# Patient Record
Sex: Male | Born: 1950 | Race: White | Hispanic: No | Marital: Married | Smoking: Former smoker
Health system: Southern US, Community
[De-identification: ages and names within clinical notes are randomized; demographics above are authoritative.]

## PROBLEM LIST (undated history)

## (undated) DIAGNOSIS — Z95810 Presence of automatic (implantable) cardiac defibrillator: Secondary | ICD-10-CM

## (undated) DIAGNOSIS — I447 Left bundle-branch block, unspecified: Secondary | ICD-10-CM

## (undated) DIAGNOSIS — I472 Ventricular tachycardia, unspecified: Secondary | ICD-10-CM

## (undated) DIAGNOSIS — E669 Obesity, unspecified: Secondary | ICD-10-CM

## (undated) DIAGNOSIS — I10 Essential (primary) hypertension: Secondary | ICD-10-CM

## (undated) DIAGNOSIS — E785 Hyperlipidemia, unspecified: Secondary | ICD-10-CM

## (undated) DIAGNOSIS — M1990 Unspecified osteoarthritis, unspecified site: Secondary | ICD-10-CM

## (undated) DIAGNOSIS — I255 Ischemic cardiomyopathy: Secondary | ICD-10-CM

## (undated) DIAGNOSIS — T465X1A Poisoning by other antihypertensive drugs, accidental (unintentional), initial encounter: Secondary | ICD-10-CM

## (undated) DIAGNOSIS — T464X5A Adverse effect of angiotensin-converting-enzyme inhibitors, initial encounter: Secondary | ICD-10-CM

## (undated) DIAGNOSIS — I219 Acute myocardial infarction, unspecified: Secondary | ICD-10-CM

## (undated) DIAGNOSIS — N141 Nephropathy induced by other drugs, medicaments and biological substances: Secondary | ICD-10-CM

## (undated) DIAGNOSIS — I519 Heart disease, unspecified: Secondary | ICD-10-CM

## (undated) DIAGNOSIS — M109 Gout, unspecified: Secondary | ICD-10-CM

## (undated) DIAGNOSIS — B019 Varicella without complication: Secondary | ICD-10-CM

## (undated) DIAGNOSIS — N200 Calculus of kidney: Secondary | ICD-10-CM

## (undated) HISTORY — DX: Hyperlipidemia, unspecified: E78.5

## (undated) HISTORY — DX: Essential (primary) hypertension: I10

## (undated) HISTORY — DX: Left bundle-branch block, unspecified: I44.7

## (undated) HISTORY — DX: Ischemic cardiomyopathy: I25.5

## (undated) HISTORY — DX: Nephropathy induced by other drugs, medicaments and biological substances: N14.1

## (undated) HISTORY — DX: Gout, unspecified: M10.9

## (undated) HISTORY — DX: Ventricular tachycardia: I47.2

## (undated) HISTORY — DX: Poisoning by other antihypertensive drugs, accidental (unintentional), initial encounter: T46.5X1A

## (undated) HISTORY — DX: Adverse effect of angiotensin-converting-enzyme inhibitors, initial encounter: T46.4X5A

## (undated) HISTORY — DX: Obesity, unspecified: E66.9

## (undated) HISTORY — DX: Heart disease, unspecified: I51.9

## (undated) HISTORY — PX: CHOLECYSTECTOMY: SHX55

## (undated) HISTORY — DX: Ventricular tachycardia, unspecified: I47.20

## (undated) HISTORY — DX: Varicella without complication: B01.9

## (undated) HISTORY — DX: Calculus of kidney: N20.0

---
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
## 1989-11-21 DIAGNOSIS — I219 Acute myocardial infarction, unspecified: Secondary | ICD-10-CM

## 1989-11-21 HISTORY — DX: Acute myocardial infarction, unspecified: I21.9

## 1998-05-01 ENCOUNTER — Encounter: Payer: Self-pay | Admitting: Internal Medicine

## 2003-09-22 ENCOUNTER — Other Ambulatory Visit: Payer: Self-pay

## 2003-11-22 HISTORY — PX: KNEE SURGERY: SHX244

## 2006-10-18 ENCOUNTER — Ambulatory Visit: Payer: Self-pay | Admitting: Internal Medicine

## 2006-10-25 ENCOUNTER — Ambulatory Visit: Payer: Self-pay | Admitting: Cardiology

## 2006-11-21 HISTORY — PX: IMPLANTABLE CARDIOVERTER DEFIBRILLATOR IMPLANT: SHX5860

## 2006-11-28 ENCOUNTER — Ambulatory Visit: Payer: Self-pay

## 2006-11-28 ENCOUNTER — Encounter: Payer: Self-pay | Admitting: Cardiology

## 2006-12-08 ENCOUNTER — Ambulatory Visit: Payer: Self-pay | Admitting: Internal Medicine

## 2006-12-08 LAB — CONVERTED CEMR LAB
Calcium: 8.8 mg/dL (ref 8.4–10.5)
Chloride: 107 meq/L (ref 96–112)
GFR calc Af Amer: 89 mL/min
GFR calc non Af Amer: 74 mL/min
INR: 1.2 (ref 0.9–2.0)

## 2006-12-12 ENCOUNTER — Ambulatory Visit: Payer: Self-pay | Admitting: Internal Medicine

## 2006-12-12 ENCOUNTER — Observation Stay (HOSPITAL_COMMUNITY): Admission: RE | Admit: 2006-12-12 | Discharge: 2006-12-13 | Payer: Self-pay | Admitting: Internal Medicine

## 2006-12-25 ENCOUNTER — Ambulatory Visit: Payer: Self-pay

## 2007-05-23 ENCOUNTER — Ambulatory Visit: Payer: Self-pay | Admitting: Internal Medicine

## 2007-08-17 ENCOUNTER — Ambulatory Visit: Payer: Self-pay | Admitting: Internal Medicine

## 2007-12-03 ENCOUNTER — Ambulatory Visit: Payer: Self-pay | Admitting: Internal Medicine

## 2007-12-03 ENCOUNTER — Ambulatory Visit: Payer: Self-pay

## 2008-02-28 ENCOUNTER — Ambulatory Visit: Payer: Self-pay | Admitting: Internal Medicine

## 2008-05-22 ENCOUNTER — Ambulatory Visit: Payer: Self-pay | Admitting: Internal Medicine

## 2008-09-04 ENCOUNTER — Ambulatory Visit: Payer: Self-pay | Admitting: Internal Medicine

## 2008-12-03 ENCOUNTER — Ambulatory Visit: Payer: Self-pay | Admitting: Internal Medicine

## 2008-12-04 ENCOUNTER — Encounter: Payer: Self-pay | Admitting: Internal Medicine

## 2009-03-02 ENCOUNTER — Ambulatory Visit: Payer: Self-pay | Admitting: Internal Medicine

## 2009-05-21 DIAGNOSIS — I255 Ischemic cardiomyopathy: Secondary | ICD-10-CM

## 2009-05-21 DIAGNOSIS — I447 Left bundle-branch block, unspecified: Secondary | ICD-10-CM

## 2009-05-21 DIAGNOSIS — I10 Essential (primary) hypertension: Secondary | ICD-10-CM | POA: Insufficient documentation

## 2009-05-28 ENCOUNTER — Ambulatory Visit: Payer: Self-pay | Admitting: Internal Medicine

## 2009-05-28 ENCOUNTER — Encounter: Payer: Self-pay | Admitting: Internal Medicine

## 2009-05-28 DIAGNOSIS — I472 Ventricular tachycardia: Secondary | ICD-10-CM

## 2009-08-27 ENCOUNTER — Ambulatory Visit: Payer: Self-pay | Admitting: Internal Medicine

## 2010-05-28 ENCOUNTER — Ambulatory Visit: Payer: Self-pay | Admitting: Internal Medicine

## 2010-05-28 DIAGNOSIS — R609 Edema, unspecified: Secondary | ICD-10-CM

## 2010-08-26 ENCOUNTER — Ambulatory Visit: Payer: Self-pay | Admitting: Internal Medicine

## 2010-12-02 ENCOUNTER — Encounter: Payer: Self-pay | Admitting: Internal Medicine

## 2010-12-02 ENCOUNTER — Ambulatory Visit
Admission: RE | Admit: 2010-12-02 | Discharge: 2010-12-02 | Payer: Self-pay | Source: Home / Self Care | Attending: Internal Medicine | Admitting: Internal Medicine

## 2010-12-21 NOTE — Cardiovascular Report (Signed)
Summary: Office Visit Remote   Office Visit Remote   Imported By: Roderic Ovens 09/30/2010 15:14:59  _____________________________________________________________________  External Attachment:    Type:   Image     Comment:   External Document

## 2010-12-21 NOTE — Assessment & Plan Note (Signed)
Summary: icd check/guidant   Visit Type:  Follow-up Primary Provider:  Aram Beecham  CC:  passed out 4 weeks ago.  History of Present Illness: Mr.Calkin is seen in followup for an ICD implanted as part of MADIT CRT.   He has a history of ischemic cardiomyopathy depressed LV function and left bundle branch block.  He had a syncopal episode on June 13. He was seen by his primary care physician. He is thought to be orthostatic. His medications were adjusted. Since that time he has had problems with increasing fluid accumulation. Shortness of breath has been stable. There has been no chest pain.     Current Medications (verified): 1)  Fish Oil 1000 Mg Caps (Omega-3 Fatty Acids) .... Once Daily 2)  Niacin Cr 500 Mg Cr-Caps (Niacin) .... Three Tabs Three Times A Day 3)  Vasotec 10 Mg Tabs (Enalapril Maleate) .... Take 1 Tablet By Mouth Once A Day 4)  Eql Vitamin E 400 Unit Caps (Vitamin E) .... Once Daily 5)  Aspirin 81 Mg Tabs (Aspirin) .... Once Daily 6)  Potassium Chloride Crys Cr 20 Meq Cr-Tabs (Potassium Chloride Crys Cr) .... Every Other Day 7)  Lasix 40 Mg Tabs (Furosemide) .... 1/2 Tab Every Other Day 8)  Metoprolol Tartrate 100 Mg Tabs (Metoprolol Tartrate) .... Take One Tablet By Mouth Twice A Day 9)  Multivitamins  Tabs (Multiple Vitamin) .... Once Daily  Allergies (verified): No Known Drug Allergies  Past History:  Past Medical History: Last updated: 05/21/2009 Ischemic cardiomyopathy.  Status post implantable cardioverter-defibrillator as part of #1. --Guidant Vitality T175 Left bundle branch block.  Obesity Hypertension Dyslipidemia MADIT - CRT trial  Past Surgical History: Last updated: 05/21/2009 AICD-Guidant Vitality T175  Vital Signs:  Patient profile:   60 year old male Height:      70 inches Weight:      300 pounds BMI:     43.20 Pulse rate:   76 / minute BP sitting:   123 / 83  (left arm) Cuff size:   large  Vitals Entered By: Hardin Negus,  RMA (May 28, 2010 3:11 PM)  Physical Exam  General:  The patient was alert and oriented in no acute distress. HEENT Normal.  Neck veins were flat, carotids were brisk.  Lungs were clear.  Heart sounds were regular without murmurs or gallops.  Abdomen was soft with active bowel sounds. There is no clubbing cyanosis or edema. Skin Warm and dry     ICD Specifications Following MD:  Sherryl Manges, MD     ICD Vendor:  [facility] [employer]     ICD Model Number:  T175     ICD Serial Number:  [id number] ICD DOI:  12/12/2006     ICD Implanting MD:  Sherryl Manges, MD  Lead 1:    Location: RV     DOI: 12/12/2006     Model #: [id number]     Serial #: [id number]     Status: active  Indications::  ICM, MADIT-CRT   ICD Follow Up Remote Check?  No Battery Voltage:  2.85 V     Charge Time:  7.7 seconds     Battery Est. Longevity:  BOL Underlying rhythm:  SR ICD Dependent:  No       ICD Device Measurements Right Ventricle:  Amplitude: 14.6 mV, Impedance: 512 ohms, Threshold: 0.6 V at 0.5 msec Shock Impedance: 44 ohms   Episodes Shock:  1     ATP:  6  Nonsustained:  1     Ventricular Pacing:  0%  Brady Parameters Mode VVI     Lower Rate Limit:  40      Tachy Zones VF:  210     VT:  180     Next Remote Date:  08/26/2010     Next Cardiology Appt Due:  05/22/2011 Tech Comments:  No parameter changes.  1 VT episodes treated with ATP x6 unseccessful, 23Jshock successful.  Mr. Angerer went to the ED @ [facility] for a syncopal episode but they  did not interrogate the ICD.  He will start Latitude transmissions every 3 months.  ROV 1 year with Dr. Graciela Husbands in [place]. Altha Harm, LPN  May 28, 2129 3:33 PM   Impression & Recommendations:  Problem # 1:  VENTRICULAR TACHYCARDIA (ICD-427.1) the patient had syncopal ventricular tachycardia,  it is monomorphic and responded shock therapy. His accelerated by antitachycardia pacing.  This is his first episode. No other medical taking changes we made at this time. We  will however resume his diuretic and increase his enalapril back to where it was.  He is advised not to drive for total of 2 months His updated medication list for this problem includes:    Vasotec 10 Mg Tabs (Enalapril maleate) .Marland Kitchen... Take 1 tablet by mouth once a day    Aspirin 81 Mg Tabs (Aspirin) ..... Once daily    Metoprolol Tartrate 100 Mg Tabs (Metoprolol tartrate) .Marland Kitchen... Take one tablet by mouth twice a day  Problem # 2:  CARDIOMYOPATHY, ISCHEMIC (ICD-414.8) stable medication adjustments as above The following medications were removed from the medication list:    Furosemide 40 Mg Tabs (Furosemide) His updated medication list for this problem includes:    Vasotec 10 Mg Tabs (Enalapril maleate) .Marland Kitchen... Take 1 tablet by mouth once a day    Aspirin 81 Mg Tabs (Aspirin) ..... Once daily    Lasix 40 Mg Tabs (Furosemide) .Marland Kitchen... Take 1 tablet by mouth once a day    Metoprolol Tartrate 100 Mg Tabs (Metoprolol tartrate) .Marland Kitchen... Take one tablet by mouth twice a day  Problem # 3:  IMPLANTATION OF DEFIBRILLATOR, GUIDANT VITALITY T 175 (ICD-V45.02) Device parameters and data were reviewed and no changes were made  Problem # 4:  PERIPHERAL EDEMA (ICD-782.3) tpatient is peripheral edema that has worsened since his diuretic was cut back; we'll increase it back to what it was. He has had metabolic profile checked by Dr. Judithann Sheen  Patient Instructions: 1)  Your physician wants you to follow-up in:   You will receive a reminder letter in the mail two months in advance. If you don't receive a letter, please call our office to schedule the follow-up appointment. 2)  Your physician has recommended you make the following change in your medication: START furosemide daily and vasotec

## 2011-01-12 NOTE — Cardiovascular Report (Signed)
Summary: Office Visit Remote   Office Visit Remote   Imported By: Roderic Ovens 01/04/2011 14:26:18  _____________________________________________________________________  External Attachment:    Type:   Image     Comment:   External Document

## 2011-03-03 ENCOUNTER — Ambulatory Visit (INDEPENDENT_AMBULATORY_CARE_PROVIDER_SITE_OTHER): Payer: PRIVATE HEALTH INSURANCE | Admitting: *Deleted

## 2011-03-03 ENCOUNTER — Other Ambulatory Visit: Payer: Self-pay

## 2011-03-03 DIAGNOSIS — I428 Other cardiomyopathies: Secondary | ICD-10-CM

## 2011-03-03 DIAGNOSIS — I472 Ventricular tachycardia: Secondary | ICD-10-CM

## 2011-03-10 NOTE — Progress Notes (Signed)
icd remote  

## 2011-03-22 ENCOUNTER — Encounter: Payer: Self-pay | Admitting: *Deleted

## 2011-04-05 NOTE — Letter (Signed)
May 22, 2008    Duane Lope. Judithann Sheen, MD  [facility]  [address]  [place], [place]  [address]   RE:  LEOPOLD, SMYERS  MRN:  [id number]  /  DOB:  29-Jun-1951   Dear [name],   Mr. Loreta Ave came in today.  As you recall, he was part of the MADIT-CRT  trial which was just closed because it showed that patients with class I  and II symptoms and left bundle-branch block in the setting of  cardiomyopathy had a 30% reduction in congestive heart failure defined  by hospitalizations and/or intravenous therapy.  We do not have the  absolute numbers yet, but is still a fairly striking finding.  Mr.  Loreta Ave was randomized to no LV lead and so we will have to address this  as we get more recommendations and an EKG, doing really well apart from  the fact that his weight has not changed.   His medications include,  1. Metoprolol 200 b.i.d.  2. Niacin 1500 t.i.d.  3. Aspirin.  4. He is also on Vasotec 10 b.i.d.   On examination, his blood pressure today was 103/62 with a pulse of 63.  His lungs were clear.  His heart sounds were regular.  His extremities  were without edema.   Interrogation of his ICD demonstrated an R-wave of 14.6 with impedance  of 524, threshold of 28.5, and battery voltage 3.20.   IMPRESSION:  1. Ischemic cardiomyopathy.  2. Left bundle-branch block.  3. Status post implantable cardioverter-defibrillator as part of MADIT-      CRT.  4. Obesity.   Mr. Loreta Ave is doing well.  We will let you know what we get in terms of  recommendations for followup in this accord.   Thanks very much for allowing [place] to participate in his care.    Sincerely,      Duke Salvia, MD, [facility]  Electronically Signed    SCK/MedQ  DD: 05/22/2008  DT: 05/22/2008  Job #: [id number]

## 2011-04-05 NOTE — Letter (Signed)
May 23, 2007    Dr. Aram Beecham   RE:  Billy [name], Billy [name]  MRN:  [id number]  /  DOB:  [birth year]   Dear [name]:   Billy [name] got lost in our office today, so it took me about an hour to  find him.  He is doing pretty well except that he has had significant  problems with peripheral edema as you know.   His medications are notable for him being on short-acting metoprolol at  100 b.i.d., Vasotec 10 b.i.d.  His Lasix dose is only 10 a day with a  potassium of 20 taken every other day.   On examination today, his blood pressure was 130/82, his pulse was 78,  LUNGS:  Clear.  HEART SOUNDS:  Regular, but he had 2 to 3+ peripheral edema bilaterally.   Interrogation of his Guidant ICD implant, that is further made at CRT  trial, demonstrates an R wave of 15 with an impedance of 563, a  threshold of 0.8 volts at 0.5 milliseconds with a high voltage impedance  of 44 ohms, there is no intercurrent therapies, there is one diverted  episode in March.   IMPRESSION:  1. Ischemic cardiomyopathy.  2. Left bundle branch block.  3. Status post implantable cardioverter-defibrillator for the above.  4. Peripheral edema.   [name], I have increased his Lasix from 10 to 40 mg a day.  I have asked  him to follow up with you in about 2 weeks to get his BMET checked.  I  have also increased his potassium from 20 every other day to 20 a day.  I have also encouraged him to decrease his fluid intake and also to be  cognizant of where the salt is in his food; even though he is not adding  salt, there is a lot of ambient salt in his diet.   I can be of any further assistance, do not hesitate to contact me.   Will plan to see him again as part of protocol followup.    Sincerely,      Duke Salvia, MD, [facility]  Electronically Signed    SCK/MedQ  DD: 05/23/2007  DT: 05/24/2007  Job #: [id number]   CC:    Jamse Mead, MD

## 2011-04-05 NOTE — Assessment & Plan Note (Signed)
[facility]                         ELECTROPHYSIOLOGY OFFICE NOTE   NAME:Bouvier, YERAY TOMAS                     MRN:          [id number]  DATE:12/03/2007                            DOB:          03/12/1951    Billy [name] is seen following ICD implantation as part of the MADIT-  CRT protocol.  He is doing terrific.  He is tolerating the transition  from metoprolol tartrate to succinate with less fatigue.  He has also  lost 20 pounds in the last 6 months.   His other medications are unchanged, Vasotec 10 mg b.i.d., niacin, fish  oil and Lasix.   EXAMINATION:  His blood pressure is 105/65, pulse is 68.  LUNGS:  Clear.  Heart sounds regular.  EXTREMITIES:  Without edema.   Interrogation of his defibrillator demonstrated an R wave of 14.8 with  an impedance of 549 and a threshold of 0.8 at 0.8.  High voltage  impedance was 44 ohms.  Battery voltage was 3.21.   IMPRESSION:  1. Ischemic cardiomyopathy.  2. Status post implantable cardioverter-defibrillator as part of #1.  3. Left bundle branch block.  4. Obesity.   Mr. Eddings is doing terrific with having undergone a 20-pound weight  loss.  He is tolerating his medications.  We will see him again in 3  months' time as part of MADIT-CRT follow-up.     Duke Salvia, MD, [facility]  Electronically Signed    SCK/MedQ  DD: 12/03/2007  DT: 12/04/2007  Job #: [id number]   cc:   Aram Beecham, MD

## 2011-04-05 NOTE — Letter (Signed)
December 03, 2008    Billy [name]. Billy Sheen, MD  [facility]  [address]  [place], [place] [address]   RE:  Billy, [name]  MRN:  [id number]  /  DOB:  04-Apr-1951   Dear Billy [name],   Billy [name] comes in today in followup for his ICD implanted for  ischemic heart disease, depressed left ventricular function as part of  MADIT - CRT trial.  He continues to do quite well without complaints of  chest pain or shortness of breath.  He has had no device discharges.   His medications include niacin, Vasotec, aspirin, potassium, Lasix, and  metoprolol at 200 mg a day.   PHYSICAL EXAMINATION:  VITAL SIGNS:  His blood pressure 106/71, his  pulse was 63.  His weight was 291 up 9 pounds in 6 months and up 13  pounds in 1 year.  This also refers that about a 40-pound weight gain in  2 years.  NECK:  His neck veins were flat.  BACK:  Surprisingly His back was without kyphosis.  LUNGS:  Clear.  CARDIOVASCULAR:  The heart sounds were regular.  ABDOMEN:  Soft.  EXTREMITIES:  Bilateral peripheral edema 2+.   Interrogation of his Guidant device demonstrates an R-wave of 80 with  impedance of 549, a threshold 0.6 and 0.5.  Battery voltage of 3.14 and  there are no intercurrent episodes.   IMPRESSION:  1. Ischemic cardiomyopathy with prior myocardial infarction with      depressed left ventricular function.  2. Status post implantable cardioverter-defibrillator implantation as      part of MADIT - CRT for ischemic cardiomyopathy with prior      myocardial infarction with depressed left ventricular function.  3. Significant peripheral edema and weight gain.  4. Obesity.   Billy [name], Billy [name] is doing pretty well.  I am surprised due to the  extent of his edema.  I have asked that he increase his Lasix from 20  every other day to 20 daily doing the same with his potassium.  He is to  let us know in a week if his weight is not down about 10 pounds.  At  that point, I felt what we could do is  increase him to 40 mg a day for  about one week's time.  At the end of that, he would need a BMET.   He is to see you as previously scheduled and we will see him again as  part of device followup.    Sincerely,      Billy Salvia, MD, [facility]  Electronically Signed    SCK/MedQ  DD: 12/03/2008  DT: 12/04/2008  Job #: [id number]

## 2011-04-05 NOTE — Letter (Signed)
August 17, 2007    Dr. Aram Beecham  [facility]  [address]  [place], [place]  [address]   RE:  Billy [name]  MRN:  [id number]  /  DOB:  18-Sep-1951   Dear Billy [name],   It was a pleasure to see Billy [name] today. As you recall, he is  status post ICD implantation as part of the MADIT CRT protocol. He is  doing quite well, his edema is less. He is having no complaints  specifically of chest pain or shortness of breath. He is able to climb a  flight of stairs or so.   His medications are notable in a couple of ways:  1. The absence of a statin.  2. He is currently on Niacin 1500 t.i.d., he is also on metoprolol      b.i.d. having previously taken Toprol (see below). He is on      Vasotec, aspirin, Lasix , potassium and fish oil.   PHYSICAL EXAMINATION:  VITAL SIGNS:  His blood pressure today was  unfortunately not records.  LUNGS:  Clear.  HEART:  Heart sounds were regular with a rate of 68.  EXTREMITIES:  1+ edema.  ABDOMEN:  Soft with active bowel sounds.   Interrogation of his Guidant Vitality ICD demonstrates an R wave of 17.2  with impedance of 542, a threshold of 0.8 at 0.5 with battery voltage of  3.22. There were no intercurrent episodes.   IMPRESSION:  1. Ischemic cardiomyopathy.  2. Class 2 congestive heart failure  3. Left bundle branch block.  4. Status post implantable cardioverter-defibrillator for the above.  5. Dyslipidemia on niacin.  6. Short acting metoprolol therapy.   Billy [name], Billy [name] is doing terrific. He told me his saga with statin;  I wonder whether red yeast rice might not be of some benefit for him as  it is a natural agent and has a statin effect and whether he may not be  able to tolerate this. I also heard recently about some patients who  take their statins q.o.d. or q.3 d. with some moderate benefit but with  moderately decreased likelihood of side effects and so I made these  suggestions to him.   The other thing  was that in the COMMA trial, short-acting metoprolol was  not nearly as effective as carvedilol; he told me that he had switched  to metoprolol b.i.d. as it was a generic. Metoprolol succinate, Toprol,  is now available in a generic and I thought that it may be worth putting  him on a once a day beta blocker as the data for these are somewhat more  robust.   Billy [name], if there is anything I can do please do not hesitate to contact  me.    Sincerely,      Duke Salvia, MD, [facility]  Electronically Signed    SCK/MedQ  DD: 08/17/2007  DT: 08/17/2007  Job #: [id number]

## 2011-04-08 NOTE — Letter (Signed)
October 18, 2006    Billy [name], M.D.  [facility]  [address]  [place], [place]  [address]   RE:  Billy [name], Billy [name]  MRN:  [id number]  /  DOB:  1951/03/29   Dear Trinna Post:   I had the pleasure to see Billy [name] at your request today for  consideration of ICD implantation.  As you know, he is a 60 year old  gentleman who suffered a myocardial infarction involving his LAD and  underwent rescue angioplasty in 1991.  His last cath is somewhat remote.  He has had negative Myoviews.  Echocardiograms have also demonstrated  progressive LV dysfunction with mild to moderate mitral regurgitation.  Surprisingly, the patient's functional status is really quite good.  He  says he can climb stairs and can keep up walking without difficulty.  He  has a remote history of syncope that occurred in the context of some  concomitant illness, this was 10 or 20 years ago.  He does not have  palpitations.   His past  medical history, in addition to the above, is notable for:  1. Hypertension.  2. Dyslipidemia.  3. Significant obesity.   His review of systems is surprisingly negative for sleep apnea symptoms.  He does snore but does not have daytime somnolence.   His past surgical history is notable as above.   SOCIAL HISTORY:  He is married, he has two kids, grand kids.  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  He does not smoke.  He  does not use recreational drugs.  He does use alcohol a couple of times  a week.  He does exercise.   On examination, he is a middle aged Caucasian male appearing his stated  age of 60.  His weight is 249 pounds which represents a significant  change from what is recorded, but I suspect it is about what is right.  His blood pressure today was 110/72, his pulse was 78.  HEENT:  No icterus or xanthoma.  The neck veins were flat.  The carotids  were brisk and full bilaterally without bruits.  BACK:  Without kyphosis or scoliosis.  LUNGS:  Clear.  HEART:  Sounds were regular without murmurs or gallops.  ABDOMEN:  Soft, with active bowel sounds without midline pulsation or  hepatomegaly.  EXTREMITIES:  Femoral pulses were 2+, distal pulses were intact, there  is no cyanosis, clubbing, or edema.  NEUROLOGICAL:  Grossly normal.  SKIN:  Warm and dry.   Electrocardiogram dated today demonstrated sinus rhythm with intervals  of 0.14/0.16/0.42.  It is a left bundle branch block like pattern with  evidence of a prior lateral infarction.   IMPRESSION:  1. Ischemic heart disease.      a.     Status post myocardial infarction.      b.     Status post revascularization years ago.      c.     Ejection fraction of 20-25% with deterioration over the last       couple of years.  2. Class I congestive heart failure symptoms.  3. Broad QRS with evidence of a prior lateral wall myocardial      infarction.  4. Significant obesity with snoring but no daytime somnolence.  5. Hypertension.   Trinna Post, Billy [name] has significant ischemic heart disease with  significant myocardial damage based on the Myoview scans.  His ejection  fraction has been deteriorating.  I am surprised how good his  functional  capacity remains.  As I discussed with you, I think ICD is appropriate  given the long-standing LV dysfunction.  I do think it would be valuable  to understand whether there is progressive coronary disease that may be  responsible for his progressive LV dysfunction and catheterization would  be appropriate.  In the event that he does not require  revascularization, I have also introduced him to the possibility of  being involved in the MADIT-CRT trial.  He is very  interested in this and I will have our research nurses contact him about  this once we have the information regarding the catheterization.  That  is tentatively set up for next week.   Alex, thanks very much for asking [place] to see him.    Sincerely,      Duke Salvia, MD, [facility]  Electronically Signed    SCK/MedQ  DD: 10/18/2006  DT: 10/18/2006  Job #: [id number]   CC:    Osborne Oman, M.D.

## 2011-04-08 NOTE — Op Note (Signed)
Billy [name], Billy [name] NO.:  [id number]   MEDICAL RECORD NO.:  [id number]          PATIENT TYPE:  INP   LOCATION:  2627                         FACILITY:  [facility]   PHYSICIAN:  Duke Salvia, MD, FACCDATE OF BIRTH:  [birth year]   DATE OF PROCEDURE:  12/12/2006  DATE OF DISCHARGE:                               OPERATIVE REPORT   PREOPERATIVE DIAGNOSES:  1. Ischemic cardiomyopathy.  2. Class I congestive heart failure.  3. Broad QRS.  4. Peripheral edema.   POSTOPERATIVE DIAGNOSES:  1. Ischemic cardiomyopathy.  2. Class I congestive heart failure.  3. Broad QRS.  4. Peripheral edema.   PROCEDURE:  Single chamber defibrillator implantation as part of the  MADIT-CRT protocol.   Following obtaining informed consent, the patient was brought to the  electrophysiology laboratory and placed on the fluoroscopic table in the  supine position.  After routine prep and drape of the left upper chest,  lidocaine was infiltrated in the prepectoral subclavicular region.  An  incision was made and carried down to the layer of prepectoral fascia  using electrocautery and sharp dissection.  A pocket was formed  similarly.  Hemostasis was obtained.   Thereafter, attention was turned to gain access of the extrathoracic  left subclavian vein, which was accomplished without difficulty without  the aspiration of air or the puncture of the artery.  A venipuncture was  accomplished.  A guide wire was placed and retained.  A 9-French sheath  was placed, which was then passed a Guidant serial [id number] dual  coil active-fixation defibrillator lead.  This was manipulated to the  right ventricular apex where the bipolar R wave was 11.1 with a pacing  impedance of 612 and a threshold of 0.9 volts at 0.9 msec.  Current at  threshold was 0.7 or so.  The current of injury was adequate.  The lead  was secured to the prepectoral fascia and then attached to a Guidant  Vitality 2 T175 ICD,  serial [id number].  Through the device bipolar R wave  was 10.8 with a pacing impedance of 512, a threshold of 0.6 volts at 0.5  msec.  High voltage impedance was 38 ohms.   With these acceptable parameters recorded, the defibrillation threshold  testing was undertaken.   Ventricular fibrillation was induced using T wave shock.  After a total  duration of 6 seconds at 14 joule shock was delivered through a measured  resistance of 29 ohms, terminating ventricular fibrillation and  restoring sinus rhythm.   After a wait of 5 minutes, ventricular fibrillation was reinduced via a  T wave shock.  After a total duration of 6.5 seconds a 14 joule shock  was delivered through a measured resistance of 40 ohms, terminating  ventricular fibrillation and restoring sinus rhythm.   With these acceptable parameters recorded, the system was implanted.  The pocket was copiously irrigated with antibiotic-containing saline  solution.  Hemostasis was assured and the leads and the pulse generator  were placed in the pocket, secured to the prepectoral fascia.  The wound  was closed in 3 layers in the normal  fashion.  The wound was washed,  dried, and a benzoin Steri-Strip dressing was applied.  Needle counts,  sponge counts, and instrument counts were correct at the end of the  procedure according to the staff.  The patient tolerated the procedure  without apparent complication.      Duke Salvia, MD, [facility]  Electronically Signed     SCK/MEDQ  D:  12/12/2006  T:  12/12/2006  Job:  [id number]   cc:   Aletha Halim, MD  Jamse Mead, MD  [facility]  Electrophysiology Laboratory  [facility]

## 2011-04-08 NOTE — Discharge Summary (Signed)
Billy [name], Billy NO.:  [id number]   MEDICAL RECORD NO.:  [id number]          PATIENT TYPE:  INP   LOCATION:  2627                         FACILITY:  [facility]   PHYSICIAN:  Duke Salvia, MD, FACCDATE OF BIRTH:  [birth year]   DATE OF ADMISSION:  12/12/2006  DATE OF DISCHARGE:  12/13/2006                               DISCHARGE SUMMARY   The patient has no known drug allergies.   PRINCIPAL DIAGNOSES:  1. Discharging day 1 status post implant Guidant Vitality 2      cardioverter defibrillator.  2. Very mild glucose intolerance, with fasting glucose on December 12, 2006  of 121, fasting glucose on December 13, 2006 of 118.  3. Ischemic cardiomyopathy, latest ejection fraction at echocardiogram      November 28, 2006 is 25%, with akinesis of the anterior wall and      apex; hypokinesis of inferior and lateral walls.  A:  History of lateral wall myocardial infarction in 1991, status post  angioplasty  B:  Left bundle branch block.  C.  Class 1 systolic, chronic congestive heart failure.   SECONDARY DIAGNOSES:  1. Hypertension.  2. Dyslipidemia.  3. Obesity.  The patient is a candidate for the made at   MADIT-CRT trial.  He will be followed up by research personnel stationed  here at [employer].   PROCEDURE:  On December 12, 2006 implantation of [facility]  cardioverter defibrillator, Dr. Sherryl Manges.  The patient has had no  postprocedural complications.   BRIEF HISTORY:  Mr. Billy [name] is a 60 year old male.  He is considered for  ICD implantation.  He suffered a myocardial infarction involving the LAD  in 1991.  He had angioplasty at that time. He has had negative Myoview  studies in the interim.  Echocardiograms have shown progressive LV  dysfunction with mild to moderate mitral regurgitation.   The patient's functional status is quite good. He has a class I  congestive heart failure status.  He is able to climb stairs and keep  walking without  difficulty. He does not experience palpitation,  He has  a room remote history of syncope that occurred in the context of some  illness 10-20 years ago.  The patient has risk factors for coronary  artery disease progression which include prior history of myocardial  infarction.  He has risk factors including hypertension, dyslipidemia,  significant obesity, and mildly elevated serum blood glucoses the  patient has ischemic heart disease, with significant myocardial  infarction based on Myoview scans. His ejection fraction has been  deteriorating. He has good functional capacity. This is surprising, in  view of his overall cardiac profile.  The implantation of a cardioverter  defibrillator is an appropriate, and there is a possibility that he can  become involved in the MADIT-CRT trial.  we will plan implant of  cardioverter defibrillator in the next week or so.   HOSPITAL COURSE:  The patient presented electively on December 12, 2006.  The patient underwent implant of a Guidant vitality single-chamber  cardioverter defibrillator bye Dr. Sherryl Manges.  He had mild soreness  at the incision site.  There is no hematoma.  Chest x-ray shows mild  bibasilar atelectasis, with no pneumothorax, and the lead is in  appropriate position.  He will have interrogation of the device prior to  discharge. Once again, laboratory studies showed a mildly elevated serum  blood glucose which will be followed by his primary caregiver.  He also  has followup with Dr. Judithann Sheen in one week for finding of mild dependent  edema.   He discharges on the following medications:  1. Metoprolol 100 mg twice daily.  2. Enteric-coated aspirin 325 mg daily.  3. Niacin 1500 mg 3 times daily.  4. Vasotec 10 mg twice daily.  5. Vitamin E 400 international units daily.  6. Fish oil 1000 mg daily.  7. He will be given a dose of Keflex at discharge to take at 3:00 on      December 13, 2006.   He has followup at [facility], [address]:  1. ICD Clinic Monday December 25, 2006 at 9:00.  2. He sees Dr. Graciela Husbands Apr 03, 2007 at 2:20 p.m.      Maple Mirza, [place]      Duke Salvia, MD, [facility]  Electronically Signed    GM/MEDQ  D:  12/13/2006  T:  12/13/2006  Job:  [id number]   cc:   Marcina Millard, M.D.  Aram Beecham, M.D.

## 2011-06-23 ENCOUNTER — Encounter: Payer: Self-pay | Admitting: Internal Medicine

## 2011-07-12 ENCOUNTER — Ambulatory Visit (INDEPENDENT_AMBULATORY_CARE_PROVIDER_SITE_OTHER): Payer: BC Managed Care – PPO | Admitting: Internal Medicine

## 2011-07-12 ENCOUNTER — Encounter: Payer: Self-pay | Admitting: Internal Medicine

## 2011-07-12 VITALS — BP 121/77 | HR 69 | Ht 70.0 in | Wt 294.0 lb

## 2011-07-12 DIAGNOSIS — I5022 Chronic systolic (congestive) heart failure: Secondary | ICD-10-CM | POA: Insufficient documentation

## 2011-07-12 DIAGNOSIS — I509 Heart failure, unspecified: Secondary | ICD-10-CM

## 2011-07-12 DIAGNOSIS — I472 Ventricular tachycardia: Secondary | ICD-10-CM

## 2011-07-12 DIAGNOSIS — I2589 Other forms of chronic ischemic heart disease: Secondary | ICD-10-CM

## 2011-07-12 DIAGNOSIS — Z95810 Presence of automatic (implantable) cardiac defibrillator: Secondary | ICD-10-CM

## 2011-07-12 LAB — ICD DEVICE OBSERVATION
BRDY-0002RV: 40 {beats}/min
DEV-0020ICD: NEGATIVE
HV IMPEDENCE: 43 Ohm
RV LEAD IMPEDENCE ICD: 518 Ohm
TZAT-0001FASTVT: 1
TZAT-0001FASTVT: 2
TZAT-0002FASTVT: NEGATIVE
TZAT-0013FASTVT: 6
TZON-0003FASTVT: 333.3 ms
TZST-0001FASTVT: 3
TZST-0001FASTVT: 4
TZST-0001FASTVT: 7
TZST-0003FASTVT: 21 J
TZST-0003FASTVT: 31 J
TZST-0003FASTVT: 31 J
VENTRICULAR PACING ICD: 0 pct

## 2011-07-12 NOTE — Assessment & Plan Note (Signed)
The patient's device was interrogated.  The information was reviewed. No changes were made in the programming.    

## 2011-07-12 NOTE — Assessment & Plan Note (Signed)
No recurrent VT 

## 2011-07-12 NOTE — Progress Notes (Signed)
  HPI  Billy [name] is a 60 y.o. male Seen n followup for an ICD implanted as part of MADIT CRT. He received a VR device He has a history of ischemic cardiomyopathy depressed LV function and left bundle branch block and syncopal VT treated via his ICD June 2011  Since that time he has had problems with increasing fluid accumulation. Shortness of breath has been stable.   The patient denies SOB, chest pain edema or palpitations.  There has been no syncope or presyncope.   Past Medical History  Diagnosis Date  . Ischemic cardiomyopathy   . LBBB (left bundle branch block)   . Obesity   . Hypertension   . Dyslipidemia   . Clinical trial exam     MADIT-CRT trial    Past Surgical History  Procedure Date  . Insert / replace / remove pacemaker     Implantable cardioverter-defibrillator as part of #1-Guidant Vitality T175    Current Outpatient Prescriptions  Medication Sig Dispense Refill  . aspirin 81 MG tablet Take 81 mg by mouth daily.        . enalapril (VASOTEC) 10 MG tablet Take 10 mg by mouth 2 (two) times daily.       . furosemide (LASIX) 40 MG tablet Take 40 mg by mouth daily.        . metoprolol (LOPRESSOR) 100 MG tablet Take 100 mg by mouth 2 (two) times daily.        . multivitamin (THERAGRAN) per tablet Take 1 tablet by mouth daily.        . niacin 500 MG CR capsule Take 1,500 mg by mouth 3 (three) times daily.        . Omega-3 Fatty Acids (FISH OIL) 1000 MG CAPS Take 1,000 mg by mouth daily.        . potassium chloride SA (K-DUR,KLOR-CON) 20 MEQ tablet Take 20 mEq by mouth every other day.        . vitamin E 400 UNIT capsule Take 400 Units by mouth daily.          No Known Allergies  Review of Systems negative except from HPI and PMH  Physical Exam Well developed and obese in no acute distress HENT normal E scleral and icterus clear Neck Supple JVP flat; carotids brisk and full Clear to ausculation Regular rate and rhythm, no murmurs gallops or rub Soft  with active bowel sounds No clubbing cyanosis trace edema Alert and oriented, grossly normal motor and sensory function Skin Warm and Dry    Assessment and  Plan

## 2011-07-12 NOTE — Assessment & Plan Note (Signed)
Stable on current meds 

## 2011-07-12 NOTE — Patient Instructions (Signed)
You are doing well. No medication changes were made. Please call us if you have new issues that need to be addressed before your next appt.  Your physician recommends that you schedule a follow-up appointment in: 1 year   

## 2011-07-12 NOTE — Assessment & Plan Note (Signed)
Discussed potential for CRT upgrade as well as role of spirnolactone  Will follow for now

## 2011-10-14 ENCOUNTER — Encounter: Payer: BC Managed Care – PPO | Admitting: *Deleted

## 2011-10-21 ENCOUNTER — Encounter: Payer: Self-pay | Admitting: *Deleted

## 2012-01-11 ENCOUNTER — Encounter: Payer: Self-pay | Admitting: Internal Medicine

## 2012-01-12 ENCOUNTER — Ambulatory Visit (INDEPENDENT_AMBULATORY_CARE_PROVIDER_SITE_OTHER): Payer: BC Managed Care – PPO | Admitting: *Deleted

## 2012-01-12 DIAGNOSIS — I428 Other cardiomyopathies: Secondary | ICD-10-CM

## 2012-01-12 DIAGNOSIS — I472 Ventricular tachycardia: Secondary | ICD-10-CM

## 2012-01-16 LAB — REMOTE ICD DEVICE
BRDY-0002RV: 40 {beats}/min
DEV-0020ICD: NEGATIVE
HV IMPEDENCE: 46 Ohm
PACEART VT: 0
TZAT-0001FASTVT: 1
TZAT-0001FASTVT: 2
TZAT-0002FASTVT: NEGATIVE
TZAT-0013FASTVT: 6
TZAT-0018FASTVT: NEGATIVE
TZON-0003FASTVT: 333.3 ms
TZST-0001FASTVT: 3
TZST-0001FASTVT: 4
TZST-0001FASTVT: 7
TZST-0003FASTVT: 21 J
VENTRICULAR PACING ICD: 0 pct

## 2012-01-19 NOTE — Progress Notes (Signed)
Remote icd check  

## 2012-04-12 ENCOUNTER — Ambulatory Visit (INDEPENDENT_AMBULATORY_CARE_PROVIDER_SITE_OTHER): Payer: BC Managed Care – PPO | Admitting: *Deleted

## 2012-04-12 ENCOUNTER — Encounter: Payer: Self-pay | Admitting: Internal Medicine

## 2012-04-12 DIAGNOSIS — I509 Heart failure, unspecified: Secondary | ICD-10-CM

## 2012-04-12 DIAGNOSIS — Z95810 Presence of automatic (implantable) cardiac defibrillator: Secondary | ICD-10-CM

## 2012-04-12 DIAGNOSIS — I5022 Chronic systolic (congestive) heart failure: Secondary | ICD-10-CM

## 2012-04-13 LAB — REMOTE ICD DEVICE
BATTERY VOLTAGE: 2.58 V
BRDY-0002RV: 40 {beats}/min
FVT: 0
HV IMPEDENCE: 43 Ohm
TOT-0006: 20130221000000
TZAT-0001FASTVT: 1
TZAT-0013FASTVT: 6
TZAT-0018FASTVT: NEGATIVE
TZAT-0018FASTVT: NEGATIVE
TZST-0001FASTVT: 3
TZST-0001FASTVT: 4
TZST-0001FASTVT: 5
TZST-0001FASTVT: 6
TZST-0003FASTVT: 14 J
TZST-0003FASTVT: 31 J
TZST-0003FASTVT: 31 J
VF: 0

## 2012-04-23 ENCOUNTER — Encounter: Payer: Self-pay | Admitting: *Deleted

## 2012-07-12 DIAGNOSIS — R0989 Other specified symptoms and signs involving the circulatory and respiratory systems: Secondary | ICD-10-CM

## 2012-07-19 ENCOUNTER — Encounter: Payer: BC Managed Care – PPO | Admitting: Internal Medicine

## 2012-07-19 ENCOUNTER — Encounter: Payer: Self-pay | Admitting: Internal Medicine

## 2012-07-19 ENCOUNTER — Ambulatory Visit (INDEPENDENT_AMBULATORY_CARE_PROVIDER_SITE_OTHER): Payer: BC Managed Care – PPO | Admitting: Internal Medicine

## 2012-07-19 VITALS — BP 118/68 | HR 63 | Ht 70.0 in | Wt 292.0 lb

## 2012-07-19 DIAGNOSIS — Z95810 Presence of automatic (implantable) cardiac defibrillator: Secondary | ICD-10-CM

## 2012-07-19 DIAGNOSIS — I2589 Other forms of chronic ischemic heart disease: Secondary | ICD-10-CM

## 2012-07-19 DIAGNOSIS — I509 Heart failure, unspecified: Secondary | ICD-10-CM

## 2012-07-19 DIAGNOSIS — I472 Ventricular tachycardia: Secondary | ICD-10-CM

## 2012-07-19 DIAGNOSIS — R609 Edema, unspecified: Secondary | ICD-10-CM

## 2012-07-19 DIAGNOSIS — I10 Essential (primary) hypertension: Secondary | ICD-10-CM

## 2012-07-19 DIAGNOSIS — I5022 Chronic systolic (congestive) heart failure: Secondary | ICD-10-CM

## 2012-07-19 LAB — ICD DEVICE OBSERVATION
BATTERY VOLTAGE: 2.58 V
BRDY-0002RV: 40 {beats}/min
DEV-0020ICD: NEGATIVE
HV IMPEDENCE: 43 Ohm
RV LEAD AMPLITUDE: 14.6 mv
RV LEAD IMPEDENCE ICD: 542 Ohm
RV LEAD THRESHOLD: 0.6 V
TZAT-0001FASTVT: 1
TZAT-0013FASTVT: 6
TZON-0003FASTVT: 333.3 ms
TZST-0001FASTVT: 3
TZST-0001FASTVT: 5
TZST-0001FASTVT: 7
TZST-0003FASTVT: 21 J
TZST-0003FASTVT: 31 J
TZST-0003FASTVT: 31 J
TZST-0003FASTVT: 31 J
VENTRICULAR PACING ICD: 0 pct

## 2012-07-19 NOTE — Patient Instructions (Addendum)
Your physician wants you to follow-up in:6 months with Dr. Graciela Husbands. You will receive a reminder letter in the mail two months in advance. If you don't receive a letter, please call our office to schedule the follow-up appointment.  Your physician recommends that you return for lab work in:2 weeks

## 2012-07-19 NOTE — Assessment & Plan Note (Signed)
Will add Mg to try and improve diuresis to reduce cramping ; he will resume lasix at regular dose

## 2012-07-19 NOTE — Assessment & Plan Note (Signed)
No intercurrent Ventricular tachycardia  

## 2012-07-19 NOTE — Progress Notes (Signed)
  HPI  Billy [name] is a 61 y.o. male Seen n followup for an ICD implanted as part of MADIT CRT. He received a VR device He has a history of ischemic cardiomyopathy depressed LV function and left bundle branch block and syncopal VT treated via his ICD June 2011  The patient denies chest pain, shortness of breath, nocturnal dyspnea,or orthopnea .he was no edema  There no palpitations, lightheadedness or syncope.    Past Medical History  Diagnosis Date  . Ischemic cardiomyopathy   . LBBB (left bundle branch block)   . Obesity   . Hypertension   . Dyslipidemia   . Dual implantable cardiac defibrillator in situ     MADIT-CRT trial  . Ventricular tachycardia     syncopal  treated via ICD    Past Surgical History  Procedure Date  . Insert / replace / remove pacemaker     Implantable cardioverter-defibrillator as part of #1-Guidant Vitality T175    Current Outpatient Prescriptions  Medication Sig Dispense Refill  . aspirin 81 MG tablet Take 81 mg by mouth daily.        . enalapril (VASOTEC) 10 MG tablet Take 10 mg by mouth 2 (two) times daily.       . furosemide (LASIX) 40 MG tablet Take 40 mg by mouth daily.        . metoprolol (LOPRESSOR) 100 MG tablet Take 100 mg by mouth 2 (two) times daily.        . multivitamin (THERAGRAN) per tablet Take 1 tablet by mouth daily.        . niacin 500 MG CR capsule Take 1,500 mg by mouth 3 (three) times daily.        . Omega-3 Fatty Acids (FISH OIL) 1000 MG CAPS Take 1,000 mg by mouth daily.        . potassium chloride SA (K-DUR,KLOR-CON) 20 MEQ tablet Take 20 mEq by mouth every other day.        . vitamin E 400 UNIT capsule Take 400 Units by mouth as needed.         No Known Allergies  Review of Systems negative except from HPI and PMH  Physical Exam BP 118/68  Pulse 63  Ht 5\' 10"  (1.778 m)  Wt 292 lb (132.45 kg)  BMI 41.90 kg/m2  Well developed and obese in no acute distress HENT normal E scleral and icterus clear Neck  Supple JVP flat; carotids brisk and full Clear to ausculation Regular rate and rhythm, no murmurs gallops or rub Soft with active bowel sounds No clubbing cyanosis 2-3+edema Alert and oriented, grossly normal motor and sensory function Skin Warm and Dry  ECG: Sinus Rhythm  @63             Intervals  15/15/44  Axis -142     Assessment and  Plan

## 2012-07-19 NOTE — Assessment & Plan Note (Signed)
Continue current meds Will consider adding aldactone

## 2012-07-19 NOTE — Assessment & Plan Note (Signed)
As above.

## 2012-07-19 NOTE — Assessment & Plan Note (Signed)
The patient's device was interrogated.  The information was reviewed. No changes were made in the programming.    

## 2012-07-20 ENCOUNTER — Telehealth: Payer: Self-pay

## 2012-07-20 NOTE — Telephone Encounter (Signed)
LMTCB re: new pt appt with dr. Darrick Huntsman 10/24/12 at 1330

## 2012-08-02 ENCOUNTER — Other Ambulatory Visit: Payer: Self-pay | Admitting: *Deleted

## 2012-08-02 ENCOUNTER — Other Ambulatory Visit (INDEPENDENT_AMBULATORY_CARE_PROVIDER_SITE_OTHER): Payer: BC Managed Care – PPO

## 2012-08-02 DIAGNOSIS — I472 Ventricular tachycardia: Secondary | ICD-10-CM

## 2012-08-02 DIAGNOSIS — I10 Essential (primary) hypertension: Secondary | ICD-10-CM

## 2012-08-02 MED ORDER — FUROSEMIDE 40 MG PO TABS: 40.0000 mg | ORAL_TABLET | Freq: Every day | ORAL | Status: DC

## 2012-08-02 NOTE — Telephone Encounter (Signed)
Refilled Lasix

## 2012-08-02 NOTE — Telephone Encounter (Signed)
Pt informed at lab appt Understanding verb.

## 2012-08-03 LAB — BASIC METABOLIC PANEL
BUN: 18 mg/dL (ref 8–27)
CO2: 23 mmol/L (ref 19–28)
Creatinine, Ser: 1.12 mg/dL (ref 0.76–1.27)
Glucose: 88 mg/dL (ref 65–99)
Sodium: 136 mmol/L (ref 134–144)

## 2012-08-03 LAB — MAGNESIUM: Magnesium: 1.5 mg/dL — ABNORMAL LOW (ref 1.6–2.6)

## 2012-08-06 ENCOUNTER — Telehealth: Payer: Self-pay

## 2012-08-09 NOTE — Telephone Encounter (Signed)
Error

## 2012-09-03 ENCOUNTER — Other Ambulatory Visit: Payer: Self-pay

## 2012-09-03 ENCOUNTER — Telehealth: Payer: Self-pay

## 2012-09-03 NOTE — Telephone Encounter (Signed)
mag level

## 2012-09-03 NOTE — Telephone Encounter (Signed)
LMTCB

## 2012-09-04 NOTE — Telephone Encounter (Signed)
Pt's wife informed. Understanding verb. Coming in Thursday.

## 2012-09-06 ENCOUNTER — Ambulatory Visit (INDEPENDENT_AMBULATORY_CARE_PROVIDER_SITE_OTHER): Payer: BC Managed Care – PPO

## 2012-09-25 NOTE — Progress Notes (Signed)
This encounter was created in error - please disregard.

## 2012-10-24 ENCOUNTER — Ambulatory Visit (INDEPENDENT_AMBULATORY_CARE_PROVIDER_SITE_OTHER): Payer: BC Managed Care – PPO | Admitting: Internal Medicine

## 2012-10-24 ENCOUNTER — Encounter: Payer: Self-pay | Admitting: Internal Medicine

## 2012-10-24 VITALS — BP 100/58 | HR 64 | Temp 97.5°F | Resp 12 | Ht 68.5 in | Wt 281.0 lb

## 2012-10-24 DIAGNOSIS — I10 Essential (primary) hypertension: Secondary | ICD-10-CM

## 2012-10-24 DIAGNOSIS — Z1331 Encounter for screening for depression: Secondary | ICD-10-CM

## 2012-10-24 DIAGNOSIS — E669 Obesity, unspecified: Secondary | ICD-10-CM

## 2012-10-24 DIAGNOSIS — Z13220 Encounter for screening for lipoid disorders: Secondary | ICD-10-CM

## 2012-10-24 DIAGNOSIS — R5383 Other fatigue: Secondary | ICD-10-CM

## 2012-10-24 DIAGNOSIS — Z23 Encounter for immunization: Secondary | ICD-10-CM

## 2012-10-24 DIAGNOSIS — Z125 Encounter for screening for malignant neoplasm of prostate: Secondary | ICD-10-CM

## 2012-10-24 MED ORDER — MAGNESIUM OXIDE 400 MG PO TABS: 400.0000 mg | ORAL_TABLET | Freq: Two times a day (BID) | ORAL | Status: DC

## 2012-10-24 NOTE — Patient Instructions (Addendum)
Return for fasting labs  At your convenience  rx for stronger  magnesium pending repeat mg check   This is  my version of a  "Low GI"  Diet:  All of the foods can be found at grocery stores and in bulk at [employer].  The Atkins protein bars and shakes are available in more varieties at [employer], [employer] and [employer].     7 AM Breakfast:  Low carbohydrate Protein  Shakes (I recommend the [employer]AS AdvantEdge "Carb Control" shakes  Or the low carb shakes by Atkins.   Both are available everywhere:  In  cases at [employer]  Or in 4 packs at grocery stores and pharmacies  2.5 carbs  (Alternative is  a toasted Arnold's Sandwhich Thin w/ peanut butter, a "Bagel Thin" with cream cheese and salmon) or  a scrambled egg burrito made with a low carb tortilla .  Avoid cereal and bananas, oatmeal too unless you are cooking the old fashioned kind that takes 30-40 minutes to prepare.  the rest is overly processed, has minimal fiber, and is loaded with carbohydrates!   10 AM: Protein bar by Atkins (the snack size, under 200 cal).  There are many varieties , available widely again or in bulk in limited varieties at [employer]Js)  Other so called "protein bars" tend to be loaded with carbohydrates.  Remember, in food advertising, the word "energy" is synonymous for " carbohydrate."  Lunch: sandwich of [place], (or any lunchmeat, grilled meat or canned tuna), fresh avocado, mayonnaise  and cheese on a lower carbohydrate pita bread, flatbread, or tortilla . Ok to use regular mayonnaise. The bread is the only source or carbohydrate that can be decreased (Joseph's makes a pita bread and a flat bread that are 50 cal and 4 net carbs ; Toufayan makes a low carb flatbread that's 100 cal and 9 net carbs  and  [place] makes a low carb whole wheat tortilla  That is 210 cal and 6 net carbs)  3 PM:  Mid day :  Another protein bar,  Or a  cheese stick (100 cal, 0 carbs),  Or 1 ounce of  almonds, walnuts, pistachios, pecans, peanuts,  Macadamia nuts. Or a  Dannon light n Fit greek yogurt, 80 cal 8 net carbs . Avoid "granola"; the dried cranberries and raisins are loaded with carbohydrates. Mixed nuts ok if no raisins or cranberries or dried fruit.      6 PM  Dinner:  "mean and green:"  Meat/chicken/fish or a high protein legume; , with a green salad, and a low GI  Veggie (broccoli, cauliflower, green beans, spinach, brussel sprouts. Lima beans) : Avoid "Low fat dressings, as well as Reyne Dumas and [address]! They are loaded with sugar! Instead use ranch, vinagrette,  Blue cheese, etc.  There is a low carb pasta by Dreamfield's available at [employer] that is acceptable and tastes great. Try Michel Angel's chicken piccata over low carb pasta. The chicken dish is 0 carbs, and can be found in frozen section at [employer]Js and [employer]. Also try [employer] "Carnitas" (pulled pork, no sauce,  0 carbs) and his pot roast.   both are in the refrigerated section at [employer]Js   9 PM snack : Breyer's "low carb" fudgsicle or  ice cream bar (Carb Smart line), or  Weight Watcher's ice cream bar , or another "no sugar added" ice cream;a serving of fresh berries/cherries with whipped cream (Avoid bananas, pineapple, grapes  and watermelon on a  regular basis because they are high in sugar)   Remember that snack Substitutions should be less than 15 to 20 carbs  Per serving. Remember to subtract fiber grams and sugar alcohols to get the "net carbs."

## 2012-10-24 NOTE — Progress Notes (Signed)
Patient ID: Billy [name], male   DOB: [birth year], 61 y.o.   MRN: [id number]     Patient Active Problem List  Diagnosis  . HYPERTENSION, BENIGN  . CARDIOMYOPATHY, ISCHEMIC  . BUNDLE BRANCH BLOCK, LEFT  . VENTRICULAR TACHYCARDIA  . PERIPHERAL EDEMA  .  implantable   defibrillator Single chamber [employer] [phone]  . Chronic systolic congestive heart failure class 2  . Hypomagnesemia  . Obesity (BMI 30-39.9)    Subjective:  CC:   Chief Complaint  Patient presents with  . Establish Care    HPI:   Billy [name] is a 61 y.o. male who presents as a new patient to establish primary care with the chief complaint of   Need for new doctor.  His last primary care visit with with Aram Beecham 2 years ago.  He had an [place] in 1991 while vacationing on [place].  Symptoms started  after riding in a car for a couple  hours. felt a pulling in his right leg/thigh. 1 hour later developed chest pain and nausea . Histor yof syncopal VT now with ICD , managed by Dr. Graciela Husbands with recent interrogation negative for episodes. Denies any history of chest pain since.  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 Bowls once a week.,  Cannot play golf.  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 Knees both hurt L > R not on a daily basis, mostly with weather change. No chronic medications, prn tylenol and motrin. 3) obesity.  Lost 20 lbs since July 2011.   Still losing weight with goal BMI 25.     Past Medical History  Diagnosis Date  . Ischemic cardiomyopathy   . LBBB (left bundle branch block)   . Obesity   . Hypertension   . Dyslipidemia   . Dual implantable cardiac defibrillator in situ     MADIT-CRT trial  . Ventricular tachycardia     syncopal  treated via ICD  . Chicken pox   . Heart disease   . Elevated blood  pressure reading   . Hyperlipidemia   . Kidney stone     Past Surgical History  Procedure Date  . Insert / replace / remove pacemaker     Implantable cardioverter-defibrillator as part of #1-[employer] Vitality T175  . Cholecystectomy   . Appendectomy 1955  . Heart attack 1991  . Knee surgery 2005  . Implantable cardioverter defibrillator implant     Family History  Problem Relation Age of Onset  . Diabetes Mother   . Heart disease Father     History   Social History  . Marital Status: Married    Spouse Name: N/A    Number of Children: N/A  . Years of Education: N/A   Occupational History  . Not on file.   Social History Main Topics  . Smoking status: Former Smoker -- 1.0 packs/day for 30 years    Types: Cigarettes    Quit date: 06/08/1990  . Smokeless tobacco: Not on file     Comment: Tobacco use-no  . Alcohol Use: Yes     Comment: Occasional  . Drug Use: No  . Sexually Active:    Other Topics Concern  . Not on file   Social History Narrative   Gets regular exercise.  No Known Allergies   Review of Systems:  Patient denies headache, fevers, malaise, unintentional weight loss, skin rash, eye pain, sinus congestion and sinus pain, sore throat, dysphagia,  hemoptysis , cough, dyspnea, wheezing, chest pain, palpitations, orthopnea, edema, abdominal pain, nausea, melena, diarrhea, constipation, flank pain, dysuria, hematuria, urinary  Frequency, nocturia, numbness, tingling, seizures,  Focal weakness, Loss of consciousness,  Tremor, insomnia, depression, anxiety, and suicidal ideation.        Objective:  BP 100/58  Pulse 64  Temp 97.5 F (36.4 C) (Oral)  Resp 12  Ht 5' 8.5" (1.74 m)  Wt 281 lb (127.461 kg)  BMI 42.10 kg/m2  SpO2 98%  General appearance: alert, cooperative and appears stated age Ears: normal TM's and external ear canals both ears Throat: lips, mucosa, and tongue normal; teeth and gums normal Neck: no adenopathy, no carotid bruit,  supple, symmetrical, trachea midline and thyroid not enlarged, symmetric, no tenderness/mass/nodules Back: symmetric, no curvature. ROM normal. No CVA tenderness. Lungs: clear to auscultation bilaterally Heart: regular rate and rhythm, S1, S2 normal, no murmur, click, rub or gallop Abdomen: soft, non-tender; bowel sounds normal; no masses,  no organomegaly Pulses: 2+ and symmetric Skin: Skin color, texture, turgor normal. No rashes or lesions Lymph nodes: Cervical, supraclavicular, and axillary nodes normal.  Assessment and Plan:  Hypomagnesemia Apparently secondary to diuretic therapy. Oral supplements and return for recheck with fasting lipids   HYPERTENSION, BENIGN Well controlled on current medications.  No changes today.  Obesity (BMI 30-39.9) He is currently managing with dietary restrictions.  I have addressed  BMI and recommended a low glycemic index diet utilizing smaller more frequent meals to increase metabolism.  I have also recommended that patient start exercising with a goal of 30 minutes of aerobic exercise a minimum of 5 days per week. Return for fasting lipid disorders, thyroid and diabetes screen   Updated Medication List Outpatient Encounter Prescriptions as of 10/24/2012  Medication Sig Dispense Refill  . aspirin 81 MG tablet Take 81 mg by mouth daily.        . cholecalciferol (VITAMIN D) 400 UNITS TABS Take by mouth.      . enalapril (VASOTEC) 10 MG tablet Take 10 mg by mouth 2 (two) times daily.       . furosemide (LASIX) 40 MG tablet Take 1 tablet (40 mg total) by mouth daily.  30 tablet  5  . Magnesium 250 MG TABS Take by mouth daily.      . metoprolol (LOPRESSOR) 100 MG tablet Take 100 mg by mouth 2 (two) times daily.        . multivitamin (THERAGRAN) per tablet Take 1 tablet by mouth daily.        . niacin 500 MG CR capsule Take 1,500 mg by mouth 3 (three) times daily.        . Omega-3 Fatty Acids (FISH OIL) 1000 MG CAPS Take 1,000 mg by mouth daily.         . potassium chloride SA (K-DUR,KLOR-CON) 20 MEQ tablet Take 20 mEq by mouth every other day.        . vitamin E 400 UNIT capsule Take 400 Units by mouth as needed.       . magnesium oxide (MAG-OX 400) 400 MG tablet Take 1 tablet (400 mg total) by mouth 2 (two) times daily.  60 tablet  11     Orders Placed This Encounter  Procedures  . Tdap vaccine greater than or equal to 7yo IM  .  Lipid panel  . Magnesium  . Comprehensive metabolic panel  . TSH  . PSA    No Follow-up on file.

## 2012-10-25 ENCOUNTER — Ambulatory Visit (INDEPENDENT_AMBULATORY_CARE_PROVIDER_SITE_OTHER): Payer: BC Managed Care – PPO | Admitting: *Deleted

## 2012-10-25 DIAGNOSIS — I2589 Other forms of chronic ischemic heart disease: Secondary | ICD-10-CM

## 2012-10-25 DIAGNOSIS — I472 Ventricular tachycardia, unspecified: Secondary | ICD-10-CM

## 2012-10-25 DIAGNOSIS — I4729 Other ventricular tachycardia: Secondary | ICD-10-CM

## 2012-10-25 DIAGNOSIS — Z95810 Presence of automatic (implantable) cardiac defibrillator: Secondary | ICD-10-CM

## 2012-10-26 ENCOUNTER — Encounter: Payer: Self-pay | Admitting: Internal Medicine

## 2012-10-26 DIAGNOSIS — E669 Obesity, unspecified: Secondary | ICD-10-CM | POA: Insufficient documentation

## 2012-10-26 NOTE — Assessment & Plan Note (Addendum)
Apparently secondary to diuretic therapy. Oral supplements and return for recheck with fasting lipids

## 2012-10-26 NOTE — Assessment & Plan Note (Signed)
Well controlled on current medications.  No changes today. 

## 2012-10-26 NOTE — Assessment & Plan Note (Signed)
He is currently managing with dietary restrictions.  I have addressed  BMI and recommended a low glycemic index diet utilizing smaller more frequent meals to increase metabolism.  I have also recommended that patient start exercising with a goal of 30 minutes of aerobic exercise a minimum of 5 days per week. Return for fasting lipid disorders, thyroid and diabetes screen

## 2012-10-31 ENCOUNTER — Other Ambulatory Visit (INDEPENDENT_AMBULATORY_CARE_PROVIDER_SITE_OTHER): Payer: BC Managed Care – PPO

## 2012-10-31 DIAGNOSIS — Z125 Encounter for screening for malignant neoplasm of prostate: Secondary | ICD-10-CM

## 2012-10-31 DIAGNOSIS — Z13220 Encounter for screening for lipoid disorders: Secondary | ICD-10-CM

## 2012-10-31 DIAGNOSIS — R5383 Other fatigue: Secondary | ICD-10-CM

## 2012-10-31 LAB — TSH: TSH: 2.02 u[IU]/mL (ref 0.35–5.50)

## 2012-10-31 LAB — COMPREHENSIVE METABOLIC PANEL
ALT: 27 U/L (ref 0–53)
Alkaline Phosphatase: 80 U/L (ref 39–117)
Calcium: 8.8 mg/dL (ref 8.4–10.5)
Chloride: 101 mEq/L (ref 96–112)
GFR: 73.72 mL/min (ref >60.00)
Glucose, Bld: 115 mg/dL — ABNORMAL HIGH (ref 70–99)
Potassium: 4.4 mEq/L (ref 3.5–5.1)
Sodium: 138 mEq/L (ref 135–145)
Total Bilirubin: 0.8 mg/dL (ref 0.3–1.2)

## 2012-10-31 LAB — LIPID PANEL: Cholesterol: 166 mg/dL (ref 0–200)

## 2012-11-01 LAB — REMOTE ICD DEVICE
BRDY-0002RV: 40 {beats}/min
CHARGE TIME: 10.2 s
DEV-0020ICD: NEGATIVE
FVT: 0
HV IMPEDENCE: 44 Ohm
PACEART VT: 0
TZAT-0001FASTVT: 1
TZAT-0002FASTVT: NEGATIVE
TZAT-0018FASTVT: NEGATIVE
TZAT-0018FASTVT: NEGATIVE
TZON-0003FASTVT: 333.3 ms
TZST-0001FASTVT: 5
TZST-0001FASTVT: 6
TZST-0003FASTVT: 31 J

## 2012-11-03 ENCOUNTER — Telehealth: Payer: Self-pay | Admitting: Internal Medicine

## 2012-11-03 NOTE — Telephone Encounter (Signed)
His magnesium level is still low,  Has he been taking the new mg oxide supplements I prescried,  How much and how often?  The rest of his labs are fine.

## 2012-11-05 NOTE — Telephone Encounter (Signed)
Spoke to patient spouse she stated that he take 500 mg of magnesium a day.

## 2012-11-05 NOTE — Telephone Encounter (Signed)
Left message on patient vm to call me back regarding lab results.

## 2012-11-05 NOTE — Telephone Encounter (Signed)
Have him switch to the magnesium oxide that I entered in his chart   400 mg twice daily.

## 2012-11-06 NOTE — Telephone Encounter (Signed)
Spoke to patient spouse gave her instructions as directed for patient.

## 2012-11-15 ENCOUNTER — Ambulatory Visit (INDEPENDENT_AMBULATORY_CARE_PROVIDER_SITE_OTHER): Payer: BC Managed Care – PPO | Admitting: Adult Health

## 2012-11-15 ENCOUNTER — Encounter: Payer: Self-pay | Admitting: Adult Health

## 2012-11-15 VITALS — BP 112/68 | HR 83 | Temp 97.5°F | Ht 68.5 in

## 2012-11-15 DIAGNOSIS — L03116 Cellulitis of left lower limb: Secondary | ICD-10-CM | POA: Insufficient documentation

## 2012-11-15 DIAGNOSIS — L03119 Cellulitis of unspecified part of limb: Secondary | ICD-10-CM

## 2012-11-15 MED ORDER — HYDROCODONE-ACETAMINOPHEN 5-500 MG PO TABS: ORAL_TABLET | ORAL | Status: DC

## 2012-11-15 MED ORDER — CEPHALEXIN 250 MG PO CAPS: ORAL_CAPSULE | ORAL | Status: DC

## 2012-11-15 NOTE — Progress Notes (Signed)
Subjective:    Patient ID: Billy [name], male    DOB: 24-Jun-1951, 61 y.o.   MRN: [id number]  HPI  Billy [name] is a very pleasant 61 y/o gentleman with hx of CAD, HTN, HLD, ICM, chronic systolic HF, LBBB, hx VT s/p ICD, hypomagnesemia on supplements who presents to clinic today thinking he has "gout". Reports that on Monday morning he woke up with pain in his left foot and he is unable to bear weight secondary to same. He also reports that the swelling in foot is worse. He is normally on a fluid pill daily; however, he has not taken this because he could not go to the bathroom. He denies joint pain or tenderness especially at the great toe. Denies fever or other systemic symptom.  Current Outpatient Prescriptions on File Prior to Visit  Medication Sig Dispense Refill  . aspirin 81 MG tablet Take 81 mg by mouth daily.        . cholecalciferol (VITAMIN D) 400 UNITS TABS Take by mouth.      . enalapril (VASOTEC) 10 MG tablet Take 10 mg by mouth 2 (two) times daily.       . furosemide (LASIX) 40 MG tablet Take 1 tablet (40 mg total) by mouth daily.  30 tablet  5  . magnesium oxide (MAG-OX 400) 400 MG tablet Take 1 tablet (400 mg total) by mouth 2 (two) times daily.  60 tablet  11  . metoprolol (LOPRESSOR) 100 MG tablet Take 100 mg by mouth 2 (two) times daily.        . multivitamin (THERAGRAN) per tablet Take 1 tablet by mouth daily.        . niacin 500 MG CR capsule Take 1,500 mg by mouth 3 (three) times daily.        . Omega-3 Fatty Acids (FISH OIL) 1000 MG CAPS Take 1,000 mg by mouth daily.        . potassium chloride SA (K-DUR,KLOR-CON) 20 MEQ tablet Take 20 mEq by mouth every other day.        . vitamin E 400 UNIT capsule Take 400 Units by mouth as needed.          Review of Systems  Constitutional: Positive for activity change. Negative for fever, chills, diaphoresis, appetite change and fatigue.  HENT: Negative.   Respiratory: Negative.   Cardiovascular: Positive for leg swelling.    Gastrointestinal: Negative.   Musculoskeletal:       Pain in plantar and dorsum of left foot.  Skin: Negative for rash and wound.  Neurological: Negative.   Psychiatric/Behavioral: Negative.      BP 112/68  Pulse 83  Temp 97.5 F (36.4 C) (Oral)  Ht 5' 8.5" (1.74 m)  SpO2 98%     Objective:   Physical Exam  Constitutional: He is oriented to person, place, and time. He appears well-developed and well-nourished. No distress.  Cardiovascular: Normal rate and regular rhythm.        1-2+ pitting edema Left dorsal foot.  Pulmonary/Chest: Effort normal and breath sounds normal.  Abdominal: Soft. There is no tenderness.  Musculoskeletal: He exhibits tenderness.       Left foot on plantar and dorsal aspect  Neurological: He is alert and oriented to person, place, and time.  Skin: Skin is warm and dry. No rash noted. There is erythema.  Psychiatric: He has a normal mood and affect. His behavior is normal. Judgment and thought content normal.      Assessment &  Plan:

## 2012-11-15 NOTE — Assessment & Plan Note (Signed)
Suspect cellulitis of left foot. He has mild erythema and 1-2+ edema. Start Keflex. Start your diuretic back to help with some of the edema. Keep foot elevated while sitting. Ordered Norco for pain.

## 2012-11-15 NOTE — Patient Instructions (Addendum)
  Please take your diuretic daily. You can buy a plastic urinal at your local pharmacy such as [facility].  Start your antibiotic today for the cellulitis of left lower extremity/foot.  You can take vicodin 1 tablet every 6 hours as needed for pain. Please do not drive or operate heavy machinery while taking this medication. It can may you very sleepy.  Please stay home from work until next week.  Call us if you do not have any relief by Monday.

## 2012-11-19 ENCOUNTER — Ambulatory Visit (INDEPENDENT_AMBULATORY_CARE_PROVIDER_SITE_OTHER)
Admission: RE | Admit: 2012-11-19 | Discharge: 2012-11-19 | Disposition: A | Discharge status: Home / Self Care | Payer: BC Managed Care – PPO | Source: Ambulatory Visit | Attending: Adult Health | Admitting: Adult Health

## 2012-11-19 ENCOUNTER — Encounter: Payer: Self-pay | Admitting: Adult Health

## 2012-11-19 ENCOUNTER — Ambulatory Visit (INDEPENDENT_AMBULATORY_CARE_PROVIDER_SITE_OTHER): Payer: BC Managed Care – PPO | Admitting: Adult Health

## 2012-11-19 VITALS — BP 145/65 | HR 78 | Temp 97.5°F | Ht 69.0 in

## 2012-11-19 DIAGNOSIS — R52 Pain, unspecified: Secondary | ICD-10-CM

## 2012-11-19 DIAGNOSIS — M1990 Unspecified osteoarthritis, unspecified site: Secondary | ICD-10-CM

## 2012-11-19 DIAGNOSIS — M79609 Pain in unspecified limb: Secondary | ICD-10-CM

## 2012-11-19 DIAGNOSIS — Z1389 Encounter for screening for other disorder: Secondary | ICD-10-CM

## 2012-11-19 DIAGNOSIS — M129 Arthropathy, unspecified: Secondary | ICD-10-CM

## 2012-11-19 DIAGNOSIS — M79671 Pain in right foot: Secondary | ICD-10-CM | POA: Insufficient documentation

## 2012-11-19 LAB — URIC ACID: Uric Acid, Serum: 11.8 mg/dL — ABNORMAL HIGH (ref 4.0–7.8)

## 2012-11-19 LAB — C-REACTIVE PROTEIN: CRP: 9.4 mg/dL (ref 0.5–20.0)

## 2012-11-19 MED ORDER — ENALAPRIL MALEATE 20 MG PO TABS: ORAL_TABLET | ORAL | Status: DC

## 2012-11-19 MED ORDER — METOPROLOL TARTRATE 100 MG PO TABS: 100.0000 mg | ORAL_TABLET | Freq: Two times a day (BID) | ORAL | Status: DC

## 2012-11-19 NOTE — Assessment & Plan Note (Addendum)
Symptoms of pain on dorsal/plantar aspect of bil feet ongoing. Hx of rheumatoid arthritis. Check CBC w/diff, ANA, sed rate, C-reactive protein, rheumatoid factor. Will also check uric acid level. Bilateral foot xray.

## 2012-11-19 NOTE — Progress Notes (Signed)
Subjective:    Patient ID: Billy [name], male    DOB: 06/07/1951, 61 y.o.   MRN: [id number]  HPI  Mr. Odowd is a very pleasant 61 y/o gentleman with hx of CAD, HTN, HLD, ICM, chronic systolic HF, LBBB, hx VT s/p ICD, hypomagnesemia on supplements who was seen in clinic on 11/15/12 for swelling in his left foot and pain. He was started on Keflex for tx of cellulitis. He presents to clinic today with reports that swelling has improved; however, pain has not. He reports that pain is in right foot as well. He is unable to bear weight. Denies any incident precipitating these events. Continues to take his fluid pill as discussed during last visit. Pain is in dorsal and plantar aspect of B. Feet. Denies any systemic symptoms.  Current Outpatient Prescriptions on File Prior to Visit  Medication Sig Dispense Refill  . cephALEXin (KEFLEX) 250 MG capsule Take 1 tablet/capsule every 6 hours for 7 days.  28 capsule  0  . cholecalciferol (VITAMIN D) 400 UNITS TABS Take by mouth.      . furosemide (LASIX) 40 MG tablet Take 1 tablet (40 mg total) by mouth daily.  30 tablet  5  . HYDROcodone-acetaminophen (NORCO/VICODIN) 5-325 MG per tablet Take 1 tablet by mouth every 6 (six) hours as needed. For pain.      . magnesium oxide (MAG-OX 400) 400 MG tablet Take 1 tablet (400 mg total) by mouth 2 (two) times daily.  60 tablet  11  . metoprolol (LOPRESSOR) 100 MG tablet Take 1 tablet (100 mg total) by mouth 2 (two) times daily.  180 tablet  3  . multivitamin (THERAGRAN) per tablet Take 1 tablet by mouth daily.        . niacin 500 MG CR capsule Take 1,500 mg by mouth 3 (three) times daily.        . Omega-3 Fatty Acids (FISH OIL) 1000 MG CAPS Take 1,000 mg by mouth daily.        . potassium chloride SA (K-DUR,KLOR-CON) 20 MEQ tablet Take 20 mEq by mouth every other day.        . vitamin E 400 UNIT capsule Take 400 Units by mouth as needed.       Marland Kitchen aspirin 81 MG tablet Take 81 mg by mouth daily.            Review of Systems  Constitutional: Positive for activity change. Negative for fever, chills and appetite change.  HENT: Negative.   Respiratory: Negative.   Cardiovascular: Positive for leg swelling.  Gastrointestinal: Negative.   Musculoskeletal:       Pain in bilateral feet, plantar and dorsal aspect. Unable to bear weight  Neurological: Negative.   Psychiatric/Behavioral: Negative.     BP 145/65  Pulse 78  Temp 97.5 F (36.4 C) (Oral)  Ht 5\' 9"  (1.753 m)  SpO2 95%     Objective:   Physical Exam  Constitutional: He is oriented to person, place, and time. He appears well-developed and well-nourished. No distress.  Cardiovascular: Normal rate and regular rhythm.   Pulmonary/Chest: Effort normal.  Abdominal: Soft.  Musculoskeletal: He exhibits edema and tenderness.       2+ edema bilateral feet. Tender to touch plantar/dorsal aspect of both feet.  Neurological: He is alert and oriented to person, place, and time.  Skin: Skin is warm and dry.  Psychiatric: He has a normal mood and affect. His behavior is normal. Judgment and thought content normal.  Assessment & Plan:

## 2012-11-19 NOTE — Patient Instructions (Addendum)
  Please have your labs drawn prior to leaving the office.    Go to the [facility] office for the xray of your feet.  Finish your antibiotic.  We will call you with your results

## 2012-11-20 ENCOUNTER — Other Ambulatory Visit: Payer: Self-pay | Admitting: Adult Health

## 2012-11-20 LAB — CBC WITH DIFFERENTIAL/PLATELET
Basophils Relative: 0.8 % (ref 0.0–3.0)
Eosinophils Absolute: 0.3 10*3/uL (ref 0.0–0.7)
Eosinophils Relative: 4.8 % (ref 0.0–5.0)
Lymphocytes Relative: 17.1 % (ref 12.0–46.0)
MCHC: 33.6 g/dL (ref 30.0–36.0)
Neutrophils Relative %: 61.6 % (ref 43.0–77.0)
RBC: 3.7 Mil/uL — ABNORMAL LOW (ref 4.22–5.81)
WBC: 7.2 10*3/uL (ref 4.5–10.5)

## 2012-11-20 LAB — RHEUMATOID FACTOR: Rhuematoid fact SerPl-aCnc: <10 IU/mL (ref <=14)

## 2012-11-20 LAB — ANA: Anti Nuclear Antibody(ANA): NEGATIVE

## 2012-11-20 MED ORDER — IBUPROFEN 800 MG PO TABS: ORAL_TABLET | ORAL | Status: DC

## 2012-11-29 ENCOUNTER — Encounter: Payer: Self-pay | Admitting: Internal Medicine

## 2013-01-13 ENCOUNTER — Other Ambulatory Visit: Payer: Self-pay | Admitting: Cardiovascular Disease

## 2013-01-14 ENCOUNTER — Other Ambulatory Visit: Payer: Self-pay

## 2013-02-07 ENCOUNTER — Other Ambulatory Visit: Payer: Self-pay

## 2013-02-07 ENCOUNTER — Ambulatory Visit (INDEPENDENT_AMBULATORY_CARE_PROVIDER_SITE_OTHER): Payer: BC Managed Care – PPO | Admitting: *Deleted

## 2013-02-07 ENCOUNTER — Encounter: Payer: Self-pay | Admitting: Internal Medicine

## 2013-02-07 DIAGNOSIS — I472 Ventricular tachycardia: Secondary | ICD-10-CM

## 2013-02-07 DIAGNOSIS — I4729 Other ventricular tachycardia: Secondary | ICD-10-CM

## 2013-02-07 DIAGNOSIS — Z95810 Presence of automatic (implantable) cardiac defibrillator: Secondary | ICD-10-CM

## 2013-02-26 ENCOUNTER — Other Ambulatory Visit: Payer: Self-pay | Admitting: Cardiology

## 2013-02-26 MED ORDER — FUROSEMIDE 40 MG PO TABS: 40.0000 mg | ORAL_TABLET | Freq: Every day | ORAL | Status: DC

## 2013-03-07 LAB — REMOTE ICD DEVICE
BATTERY VOLTAGE: 2.56 V
FVT: 1
HV IMPEDENCE: 39 Ohm
RV LEAD AMPLITUDE: 12.8 mv
RV LEAD IMPEDENCE ICD: 500 Ohm
TZAT-0001FASTVT: 1
TZAT-0002FASTVT: NEGATIVE
TZAT-0013FASTVT: 6
TZAT-0018FASTVT: NEGATIVE
TZST-0001FASTVT: 4
TZST-0001FASTVT: 5
TZST-0003FASTVT: 31 J

## 2013-03-13 ENCOUNTER — Encounter: Payer: Self-pay | Admitting: Internal Medicine

## 2013-03-13 ENCOUNTER — Ambulatory Visit (INDEPENDENT_AMBULATORY_CARE_PROVIDER_SITE_OTHER): Payer: BC Managed Care – PPO | Admitting: Internal Medicine

## 2013-03-13 VITALS — BP 122/58 | HR 69 | Temp 97.8°F | Resp 16 | Ht 68.0 in | Wt 278.0 lb

## 2013-03-13 DIAGNOSIS — I10 Essential (primary) hypertension: Secondary | ICD-10-CM

## 2013-03-13 DIAGNOSIS — Z0000 Encounter for general adult medical examination without abnormal findings: Secondary | ICD-10-CM

## 2013-03-13 DIAGNOSIS — I2589 Other forms of chronic ischemic heart disease: Secondary | ICD-10-CM

## 2013-03-13 LAB — FECAL OCCULT BLOOD, GUAIAC: Fecal Occult Blood: NEGATIVE

## 2013-03-13 NOTE — Patient Instructions (Addendum)
Consider getting a sleep  Study this year.  I can set you up for this.  Sleep apnea can cause heart failure if not treated and increaseds your risk for sudden death  Stop your magnesium and return in one week for mg levrel and uric acid level (yo do not need to fast.  We will check kidney funciton to so you will not need labs again until October.   Please return the home stool test when you come back for the labs

## 2013-03-13 NOTE — Progress Notes (Signed)
Patient ID: Billy [name], male   DOB: 1951/10/21, 62 y.o.   MRN: [id number]  The patient is here for his annual male physical examination and management of other chronic and acute problems.   The risk factors are reflected in the social history.  The roster of all physicians providing medical care to patient - is listed in the Snapshot section of the chart.  Activities of daily living:  The patient is 100% independent in all ADLs: dressing, toileting, feeding as well as independent mobility  Home safety : The patient has smoke detectors in the home. He wears seatbelts.  There are no firearms at home. There is no violence in the home.   There is no risks for hepatitis, STDs or HIV. There is no   history of blood transfusion. There is no travel history to infectious disease endemic areas of the world.  The patient has seen their dentist in the last six month and  their eye doctor in the last year.  They do not  have excessive sun exposure. They have seen a dermatoloigist in the last year. Discussed the need for sun protection: hats, long sleeves and use of sunscreen if there is significant sun exposure.   Diet: the importance of a healthy diet is discussed. They do have a healthy diet.  The benefits of regular aerobic exercise were discussed. He exercises a minimum of 30 minutes  5 days per week. Depression screen: there are no signs or vegative symptoms of depression- irritability, change in appetite, anhedonia, sadness/tearfullness.  The following portions of the patient's history were reviewed and updated as appropriate: allergies, current medications, past family history, past medical history,  past surgical history, past social history  and problem list.  Visual acuity was not assessed per patient preference since he has regular follow up with his ophthalmologist. Hearing and body mass index were assessed and reviewed.   During the course of the visit the patient was educated and  counseled about appropriate screening and preventive services including :  nutrition counseling, colorectal cancer screening, and recommended immunizations.    Objective:  BP 122/58  Pulse 69  Temp(Src) 97.8 F (36.6 C) (Oral)  Resp 16  Ht 5\' 8"  (1.727 m)  Wt 278 lb (126.1 kg)  BMI 42.28 kg/m2  SpO2 98%  General Appearance:    Alert, cooperative, no distress, appears stated age  Head:    Normocephalic, without obvious abnormality, atraumatic  Eyes:    PERRL, conjunctiva/corneas clear, EOM's intact, fundi    benign, both eyes       Ears:    Normal TM's and external ear canals, both ears  Nose:   Nares normal, septum midline, mucosa normal, no drainage   or sinus tenderness  Throat:   Lips, mucosa, and tongue normal; teeth and gums normal  Neck:   Supple, symmetrical, trachea midline, no adenopathy;       thyroid:  No enlargement/tenderness/nodules; no carotid   bruit or JVD  Back:     Symmetric, no curvature, ROM normal, no CVA tenderness  Lungs:     Clear to auscultation bilaterally, respirations unlabored  Chest wall:    No tenderness or deformity  Heart:    Regular rate and rhythm, S1 and S2 normal, no murmur, rub   or gallop  Abdomen:     Soft, non-tender, bowel sounds active all four quadrants,    no masses, no organomegaly  Genitalia:    Normal male without lesion, discharge or tenderness  Rectal:    Normal tone, normal prostate, no masses or tenderness;   guaiac negative stool  Extremities:   Extremities normal, atraumatic, no cyanosis or edema  Pulses:   2+ and symmetric all extremities  Skin:   Skin color, texture, turgor normal, no rashes or lesions  Lymph nodes:   Cervical, supraclavicular, and axillary nodes normal  Neurologic:   CNII-XII intact. Normal strength, sensation and reflexes      throughout    Assessment and Plan:  Routine general medical examination at a health care facility Annual male exam was done including testicular and prostate exam. PSA is  normal .  Colon ca screening was reviewed and options given.  He has chosed annual fobts due to heart failure.  An FOBT in offce was negative.  Sent home with home test as well.    HYPERTENSION, BENIGN Well controlled on current regimen. Renal function stable, no changes today.   Updated Medication List Outpatient Encounter Prescriptions as of 03/13/2013  Medication Sig Dispense Refill  . aspirin 81 MG tablet Take 81 mg by mouth daily.        . cholecalciferol (VITAMIN D) 400 UNITS TABS Take by mouth.      . enalapril (VASOTEC) 20 MG tablet Take 1/2 tablet twice a day  90 tablet  3  . furosemide (LASIX) 40 MG tablet Take 1 tablet (40 mg total) by mouth daily.  30 tablet  3  . ibuprofen (ADVIL,MOTRIN) 800 MG tablet Take 1 tablet every 8 hours for inflammation. Take for 10 days. Take with food.  30 tablet  0  . magnesium oxide (MAG-OX 400) 400 MG tablet Take 1 tablet (400 mg total) by mouth 2 (two) times daily.  60 tablet  11  . metoprolol (LOPRESSOR) 100 MG tablet Take 1 tablet (100 mg total) by mouth 2 (two) times daily.  180 tablet  3  . multivitamin (THERAGRAN) per tablet Take 1 tablet by mouth daily.        . niacin 500 MG CR capsule Take 1,500 mg by mouth 3 (three) times daily.        . Omega-3 Fatty Acids (FISH OIL) 1000 MG CAPS Take 1,000 mg by mouth daily.        . potassium chloride SA (K-DUR,KLOR-CON) 20 MEQ tablet Take 20 mEq by mouth every other day.        . cephALEXin (KEFLEX) 250 MG capsule Take 1 tablet/capsule every 6 hours for 7 days.  28 capsule  0  . HYDROcodone-acetaminophen (NORCO/VICODIN) 5-325 MG per tablet Take 1 tablet by mouth every 6 (six) hours as needed. For pain.      . vitamin E 400 UNIT capsule Take 400 Units by mouth as needed.        No facility-administered encounter medications on file as of 03/13/2013.

## 2013-03-14 NOTE — Assessment & Plan Note (Signed)
Well controlled on current regimen. Renal function stable, no changes today. 

## 2013-03-14 NOTE — Assessment & Plan Note (Signed)
Annual male exam was done including testicular and prostate exam. PSA is normal .  Colon ca screening was reviewed and options given.  He has chosed annual fobts due to heart failure.  An FOBT in offce was negative.  Sent home with home test as well.

## 2013-03-19 ENCOUNTER — Telehealth: Payer: Self-pay | Admitting: *Deleted

## 2013-03-19 DIAGNOSIS — D649 Anemia, unspecified: Secondary | ICD-10-CM

## 2013-03-19 DIAGNOSIS — E785 Hyperlipidemia, unspecified: Secondary | ICD-10-CM

## 2013-03-19 NOTE — Telephone Encounter (Signed)
Pt is coming in tomorrow 04.30.2014 for labs what labs and dx would you like?  Thank you

## 2013-03-19 NOTE — Telephone Encounter (Signed)
Pt is coming in for labs tomorrow 04.30.2014 what labs and dx would you like ?  Thank you

## 2013-03-20 ENCOUNTER — Other Ambulatory Visit: Payer: Self-pay | Admitting: Internal Medicine

## 2013-03-20 ENCOUNTER — Other Ambulatory Visit (INDEPENDENT_AMBULATORY_CARE_PROVIDER_SITE_OTHER): Payer: BC Managed Care – PPO

## 2013-03-20 ENCOUNTER — Encounter: Payer: Self-pay | Admitting: Internal Medicine

## 2013-03-20 DIAGNOSIS — D696 Thrombocytopenia, unspecified: Secondary | ICD-10-CM

## 2013-03-20 DIAGNOSIS — D649 Anemia, unspecified: Secondary | ICD-10-CM

## 2013-03-20 DIAGNOSIS — R7989 Other specified abnormal findings of blood chemistry: Secondary | ICD-10-CM

## 2013-03-20 DIAGNOSIS — E785 Hyperlipidemia, unspecified: Secondary | ICD-10-CM

## 2013-03-20 LAB — COMPREHENSIVE METABOLIC PANEL
Albumin: 3.1 g/dL — ABNORMAL LOW (ref 3.5–5.2)
Alkaline Phosphatase: 92 U/L (ref 39–117)
BUN: 19 mg/dL (ref 6–23)
CO2: 32 mEq/L (ref 19–32)
Calcium: 8.6 mg/dL (ref 8.4–10.5)
GFR: 73.63 mL/min (ref >60.00)
Glucose, Bld: 82 mg/dL (ref 70–99)
Potassium: 4 mEq/L (ref 3.5–5.1)

## 2013-03-20 LAB — TSH: TSH: 2 u[IU]/mL (ref 0.35–5.50)

## 2013-03-20 LAB — FERRITIN: Ferritin: 59.5 ng/mL (ref 22.0–322.0)

## 2013-03-20 LAB — IRON AND TIBC: TIBC: 330 ug/dL (ref 215–435)

## 2013-03-20 LAB — CBC WITH DIFFERENTIAL/PLATELET
Basophils Relative: 0.4 % (ref 0.0–3.0)
Eosinophils Relative: 2.1 % (ref 0.0–5.0)
HCT: 34.2 % — ABNORMAL LOW (ref 39.0–52.0)
MCV: 95 fl (ref 78.0–100.0)
Monocytes Absolute: 0.7 10*3/uL (ref 0.1–1.0)
Monocytes Relative: 14.2 % — ABNORMAL HIGH (ref 3.0–12.0)
Neutrophils Relative %: 39.9 % — ABNORMAL LOW (ref 43.0–77.0)
RBC: 3.6 Mil/uL — ABNORMAL LOW (ref 4.22–5.81)
WBC: 5.3 10*3/uL (ref 4.5–10.5)

## 2013-03-20 LAB — LIPID PANEL: VLDL: 6.8 mg/dL (ref 0.0–40.0)

## 2013-03-21 LAB — FOLATE RBC: RBC Folate: 1468 ng/mL (ref >=366)

## 2013-03-22 ENCOUNTER — Telehealth: Payer: Self-pay

## 2013-03-22 NOTE — Telephone Encounter (Signed)
MyChart Message: You had a couple of abnormal labs. Your hemoglobin was still a little low and your platelets are now a little low and one of your liver enzymes was mildly elevated. I would like you to repeat the blood work (nonfasting) in a month and follow up with me to discuss any persistent abnormalities.   Left message for patient to call office back or look at his MyChart message.

## 2013-03-25 ENCOUNTER — Other Ambulatory Visit: Payer: BC Managed Care – PPO

## 2013-03-26 ENCOUNTER — Other Ambulatory Visit: Payer: BC Managed Care – PPO

## 2013-03-27 ENCOUNTER — Other Ambulatory Visit (INDEPENDENT_AMBULATORY_CARE_PROVIDER_SITE_OTHER): Payer: BC Managed Care – PPO

## 2013-03-27 ENCOUNTER — Other Ambulatory Visit: Payer: Self-pay | Admitting: *Deleted

## 2013-03-27 DIAGNOSIS — Z1211 Encounter for screening for malignant neoplasm of colon: Secondary | ICD-10-CM

## 2013-03-28 ENCOUNTER — Encounter: Payer: Self-pay | Admitting: Internal Medicine

## 2013-04-01 ENCOUNTER — Telehealth: Payer: Self-pay

## 2013-04-01 NOTE — Telephone Encounter (Signed)
MyChart Message: Your Fecal occult blood test was normal. We will repeat these every year for colon Ca screening   Notified patient that his fecal occult blood test was normal. And we will repeat this every year for colon Ca screening

## 2013-04-29 ENCOUNTER — Encounter: Payer: Self-pay | Admitting: Internal Medicine

## 2013-04-29 ENCOUNTER — Ambulatory Visit (INDEPENDENT_AMBULATORY_CARE_PROVIDER_SITE_OTHER): Payer: BC Managed Care – PPO | Admitting: Internal Medicine

## 2013-04-29 VITALS — BP 108/54 | HR 71 | Temp 98.5°F | Resp 16 | Wt 271.5 lb

## 2013-04-29 DIAGNOSIS — R7989 Other specified abnormal findings of blood chemistry: Secondary | ICD-10-CM

## 2013-04-29 DIAGNOSIS — D649 Anemia, unspecified: Secondary | ICD-10-CM | POA: Insufficient documentation

## 2013-04-29 DIAGNOSIS — D696 Thrombocytopenia, unspecified: Secondary | ICD-10-CM

## 2013-04-29 DIAGNOSIS — I38 Endocarditis, valve unspecified: Secondary | ICD-10-CM

## 2013-04-29 DIAGNOSIS — R011 Cardiac murmur, unspecified: Secondary | ICD-10-CM

## 2013-04-29 LAB — CBC WITH DIFFERENTIAL/PLATELET
Basophils Relative: 0.4 % (ref 0.0–3.0)
Hemoglobin: 11.9 g/dL — ABNORMAL LOW (ref 13.0–17.0)
Lymphocytes Relative: 48.7 % — ABNORMAL HIGH (ref 12.0–46.0)
Lymphs Abs: 2.1 10*3/uL (ref 0.7–4.0)
MCHC: 33.5 g/dL (ref 30.0–36.0)
MCV: 98.4 fl (ref 78.0–100.0)
RBC: 3.62 Mil/uL — ABNORMAL LOW (ref 4.22–5.81)
RDW: 14.4 % (ref 11.5–14.6)

## 2013-04-29 LAB — HEPATIC FUNCTION PANEL
ALT: 25 U/L (ref 0–53)
AST: 36 U/L (ref 0–37)
Bilirubin, Direct: 0.2 mg/dL (ref 0.0–0.3)
Total Bilirubin: 0.8 mg/dL (ref 0.3–1.2)

## 2013-04-29 NOTE — Assessment & Plan Note (Addendum)
Mild,stable wiht no signs of iron  deficiency or GI bleed. Will repeat cbc in one month

## 2013-04-29 NOTE — Assessment & Plan Note (Signed)
Mild , stable, will repeat in one month and refer for plts < 100

## 2013-04-29 NOTE — Progress Notes (Addendum)
Patient ID: Billy [name], male   DOB: 10/11/1951, 62 y.o.   MRN: [id number]  Patient Active Problem List   Diagnosis Date Noted  . Anemia 04/29/2013  . Thrombocytopenia, unspecified 04/29/2013  . Murmur, diastolic 04/29/2013  . Routine general medical examination at a health care facility 03/13/2013  . Pain in both feet 11/19/2012  . Cellulitis of foot, left 11/15/2012  . Hypomagnesemia 10/26/2012  . Obesity (BMI 30-39.9) 10/26/2012  .  implantable   defibrillator Single chamber [employer] [phone] 07/12/2011  . Chronic systolic congestive heart failure class 2 07/12/2011  . PERIPHERAL EDEMA 05/28/2010  . VENTRICULAR TACHYCARDIA 05/28/2009  . HYPERTENSION, BENIGN 05/21/2009  . CARDIOMYOPATHY, ISCHEMIC 05/21/2009  . BUNDLE BRANCH BLOCK, LEFT 05/21/2009    Subjective:  CC:   Chief Complaint  Patient presents with  . Follow-up    1 month    HPI:   Billy [name] a 62 y.o. male who presents  With a  Need for new doctor.  His last primary care visit with with Aram Beecham 2 years ago.  He had an [place] in 1991 while vacationing on [place].  Symptoms started  after riding in a car for a couple  hours. felt a pulling in his right leg/thigh. 1 hour later developed chest pain and nausea . Histor yof syncopal VT now with ICD , managed by Dr. Graciela Husbands with recent interrogation negative for episodes. Denies any history of chest pain since.  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 Bowls once a week.,  Cannot play golf.  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 Knees both hurt L > R not on a daily basis, mostly with weather change. No chronic medications, prn tylenol and motrin. 3) obesity.  Lost 20 lbs since July 2011.   Still losing weight with goal BMI 25.     Past Medical History  Diagnosis Date  .  Ischemic cardiomyopathy   . LBBB (left bundle branch block)   . Obesity   . Hypertension   . Dyslipidemia   . Dual implantable cardiac defibrillator in situ     MADIT-CRT trial  . Ventricular tachycardia     syncopal  treated via ICD  . Chicken pox   . Heart disease   . Elevated blood pressure reading   . Hyperlipidemia   . Kidney stone     Past Surgical History  Procedure Laterality Date  . Insert / replace / remove pacemaker      Implantable cardioverter-defibrillator as part of #1-Guidant Vitality T175  . Cholecystectomy    . Appendectomy  1955  . Heart attack  1991  . Knee surgery  2005  . Implantable cardioverter defibrillator implant         The following portions of the patient's history were reviewed and updated as appropriate: Allergies, current medications, and problem list.    Review of Systems:   12 Pt  review of systems was negative except those addressed in the HPI,     History   Social History  . Marital Status: Married    Spouse Name: N/A    Number of Children: N/A  . Years of Education: N/A   Occupational History  . Not on file.   Social History Main Topics  . Smoking status: Former Smoker --  1.00 packs/day for 30 years    Types: Cigarettes    Quit date: 06/08/1990  . Smokeless tobacco: Not on file     Comment: Tobacco use-no  . Alcohol Use: 1.5 oz/week    3 drink(s) per week     Comment: Occasional  . Drug Use: No  . Sexually Active: Not on file   Other Topics Concern  . Not on file   Social History Narrative   Gets regular exercise.    Objective:  BP 108/54  Pulse 71  Temp(Src) 98.5 F (36.9 C) (Oral)  Resp 16  Wt 271 lb 8 oz (123.152 kg)  BMI 41.29 kg/m2  SpO2 98%  General appearance: alert, cooperative and appears stated age Ears: normal TM's and external ear canals both ears Throat: lips, mucosa, and tongue normal; teeth and gums normal Neck: no adenopathy, no carotid bruit, supple, symmetrical, trachea midline  and thyroid not enlarged, symmetric, no tenderness/mass/nodules Back: symmetric, no curvature. ROM normal. No CVA tenderness. Lungs: clear to auscultation bilaterally Heart: regular rate and rhythm, S1, S2 normal, crescendo diastolid murmur without click, rub or gallop Abdomen: soft, non-tender; bowel sounds normal; no masses,  no organomegaly Pulses: 2+ and symmetric Skin: Skin color, texture, turgor normal. No rashes or lesions Lymph nodes: Cervical, supraclavicular, and axillary nodes normal.   Assessment and Plan:  Anemia Mild,stable with no signs of iron  deficiency or GI bleed. Will repeat cbc in one month  Thrombocytopenia, unspecified Mild , stable, will repeat in one month and refer for plts < 100  Murmur, diastolic New onset.  In the setting of mild anemia and trombocytopenia, needs further rworkup with ECHO.  Will refer to [facility] cardiology for evaluation    Updated Medication List Outpatient Encounter Prescriptions as of 04/29/2013  Medication Sig Dispense Refill  . aspirin 81 MG tablet Take 81 mg by mouth daily.        . cholecalciferol (VITAMIN D) 400 UNITS TABS Take by mouth.      . enalapril (VASOTEC) 20 MG tablet Take 1/2 tablet twice a day  90 tablet  3  . furosemide (LASIX) 40 MG tablet Take 1 tablet (40 mg total) by mouth daily.  30 tablet  3  . magnesium oxide (MAG-OX 400) 400 MG tablet Take 1 tablet (400 mg total) by mouth 2 (two) times daily.  60 tablet  11  . metoprolol (LOPRESSOR) 100 MG tablet Take 1 tablet (100 mg total) by mouth 2 (two) times daily.  180 tablet  3  . multivitamin (THERAGRAN) per tablet Take 1 tablet by mouth daily.        . niacin 500 MG CR capsule Take 1,500 mg by mouth 3 (three) times daily.        . vitamin E 400 UNIT capsule Take 400 Units by mouth as needed.       . cephALEXin (KEFLEX) 250 MG capsule Take 1 tablet/capsule every 6 hours for 7 days.  28 capsule  0  . HYDROcodone-acetaminophen (NORCO/VICODIN) 5-325 MG per tablet Take 1  tablet by mouth every 6 (six) hours as needed. For pain.      Marland Kitchen ibuprofen (ADVIL,MOTRIN) 800 MG tablet Take 1 tablet every 8 hours for inflammation. Take for 10 days. Take with food.  30 tablet  0  . Omega-3 Fatty Acids (FISH OIL) 1000 MG CAPS Take 1,000 mg by mouth daily.        . potassium chloride SA (K-DUR,KLOR-CON) 20 MEQ tablet Take 20 mEq by  mouth every other day.         No facility-administered encounter medications on file as of 04/29/2013.     No orders of the defined types were placed in this encounter.    No Follow-up on file.

## 2013-04-29 NOTE — Assessment & Plan Note (Signed)
New onset.  In the setting of mild anemia and trombocytopenia, needs further rworkup with ECHO.  Will refer to [facility] cardiology for evaluation

## 2013-04-30 ENCOUNTER — Encounter: Payer: Self-pay | Admitting: Internal Medicine

## 2013-05-01 ENCOUNTER — Encounter: Payer: Self-pay | Admitting: Internal Medicine

## 2013-05-01 DIAGNOSIS — D649 Anemia, unspecified: Secondary | ICD-10-CM

## 2013-05-01 DIAGNOSIS — D696 Thrombocytopenia, unspecified: Secondary | ICD-10-CM

## 2013-05-01 DIAGNOSIS — D72819 Decreased white blood cell count, unspecified: Secondary | ICD-10-CM

## 2013-05-03 ENCOUNTER — Ambulatory Visit: Payer: Self-pay | Admitting: Oncology

## 2013-05-07 ENCOUNTER — Other Ambulatory Visit: Payer: BC Managed Care – PPO

## 2013-05-08 ENCOUNTER — Encounter: Payer: Self-pay | Admitting: Internal Medicine

## 2013-05-16 ENCOUNTER — Encounter: Payer: BC Managed Care – PPO | Admitting: *Deleted

## 2013-05-22 ENCOUNTER — Ambulatory Visit: Payer: Self-pay | Admitting: Internal Medicine

## 2013-05-22 LAB — PROTIME-INR: Prothrombin Time: 15.6 secs — ABNORMAL HIGH (ref 11.5–14.7)

## 2013-05-22 LAB — CBC CANCER CENTER
Comment - H1-Com1: NORMAL
Eosinophil: 2 %
HCT: 35.7 % — ABNORMAL LOW (ref 40.0–52.0)
HGB: 11.7 g/dL — ABNORMAL LOW (ref 13.0–18.0)
Lymphocytes: 46 %
MCH: 31.3 pg (ref 26.0–34.0)
MCHC: 32.9 g/dL (ref 32.0–36.0)
MCV: 95 fL (ref 80–100)
Monocytes: 12 %
Platelet: 133 x10 3/mm — ABNORMAL LOW (ref 150–440)
RBC: 3.76 x10 6/mm — ABNORMAL LOW (ref 4.40–5.90)
RDW: 13.7 % (ref 11.5–14.5)
Segmented Neutrophils: 39 %
Variant Lymphocyte: 1 %
WBC: 4.8 x10 3/mm (ref 3.8–10.6)

## 2013-05-22 LAB — IRON AND TIBC
Iron Bind.Cap.(Total): 347 ug/dL (ref 250–450)
Iron Saturation: 22 %
Unbound Iron-Bind.Cap.: 269 ug/dL

## 2013-05-22 LAB — RETICULOCYTES
Absolute Retic Count: 0.0351 x10 6/uL
Reticulocyte: 0.94 %

## 2013-05-22 LAB — FOLATE: Folic Acid: 31.5 ng/mL (ref 3.1–100.0)

## 2013-05-22 LAB — FIBRINOGEN: Fibrinogen: 288 mg/dL (ref 210–470)

## 2013-05-23 LAB — PROT IMMUNOELECTROPHORES(ARMC)

## 2013-05-30 LAB — CREATININE, SERUM: EGFR (African American): >60

## 2013-06-04 ENCOUNTER — Other Ambulatory Visit: Payer: Self-pay | Admitting: *Deleted

## 2013-06-04 MED ORDER — FUROSEMIDE 40 MG PO TABS: 40.0000 mg | ORAL_TABLET | Freq: Every day | ORAL | Status: DC

## 2013-06-21 ENCOUNTER — Ambulatory Visit: Payer: Self-pay | Admitting: Internal Medicine

## 2013-07-18 ENCOUNTER — Other Ambulatory Visit: Payer: BC Managed Care – PPO

## 2013-07-18 ENCOUNTER — Encounter: Payer: Self-pay | Admitting: Internal Medicine

## 2013-07-18 ENCOUNTER — Ambulatory Visit (INDEPENDENT_AMBULATORY_CARE_PROVIDER_SITE_OTHER): Payer: BC Managed Care – PPO | Admitting: Internal Medicine

## 2013-07-18 VITALS — BP 100/60 | HR 68 | Ht 70.0 in | Wt 276.0 lb

## 2013-07-18 DIAGNOSIS — I2589 Other forms of chronic ischemic heart disease: Secondary | ICD-10-CM

## 2013-07-18 DIAGNOSIS — Z95810 Presence of automatic (implantable) cardiac defibrillator: Secondary | ICD-10-CM

## 2013-07-18 DIAGNOSIS — R011 Cardiac murmur, unspecified: Secondary | ICD-10-CM

## 2013-07-18 DIAGNOSIS — I38 Endocarditis, valve unspecified: Secondary | ICD-10-CM

## 2013-07-18 DIAGNOSIS — R609 Edema, unspecified: Secondary | ICD-10-CM

## 2013-07-18 DIAGNOSIS — I509 Heart failure, unspecified: Secondary | ICD-10-CM

## 2013-07-18 DIAGNOSIS — I472 Ventricular tachycardia: Secondary | ICD-10-CM

## 2013-07-18 DIAGNOSIS — I5022 Chronic systolic (congestive) heart failure: Secondary | ICD-10-CM

## 2013-07-18 LAB — ICD DEVICE OBSERVATION
BATTERY VOLTAGE: 2.54 V
FVT: 1
RV LEAD IMPEDENCE ICD: 506 Ohm
TZAT-0001FASTVT: 1
TZAT-0002FASTVT: NEGATIVE
TZAT-0013FASTVT: 6
TZAT-0018FASTVT: NEGATIVE
TZON-0003FASTVT: 333.3 ms
TZST-0001FASTVT: 4
TZST-0001FASTVT: 5
TZST-0003FASTVT: 21 J
TZST-0003FASTVT: 31 J

## 2013-07-18 NOTE — Assessment & Plan Note (Signed)
Is stable class II symptoms; he is also significantly volume overloaded probably from his copious fluid intake is efforts to try to ward  offl gout. I suggested he try to decrease his fluid intake and 3 L--2 L a day

## 2013-07-18 NOTE — Assessment & Plan Note (Signed)
As above.

## 2013-07-18 NOTE — Progress Notes (Signed)
Patient Care Team: Sherlene Shams, MD as PCP - General (Internal Medicine)   HPI  Billy [name] is a 62 y.o. male Seen n followup for an ICD implanted as part of MADIT CRT. He received a VR device  He has a history of ischemic cardiomyopathy depressed LV function and left bundle branch block and syncopal VT treated via his ICD June 2011  The patient denies chest pain, shortness of breath, nocturnal dyspnea,or orthopnea .and no significant edema and this is been complicated by copious intake of fluid, 3 L a day, which he is on to clarify off gout There no palpitations, lightheadedness or syncope.  He snores and has daytime somnolence  He was recently detected to have a diastolic murmur. An echo was ordered but never completed   Past Medical History  Diagnosis Date  . Ischemic cardiomyopathy   . LBBB (left bundle branch block)   . Obesity   . Hypertension   . Dyslipidemia   . Dual implantable cardiac defibrillator in situ     MADIT-CRT trial  . Ventricular tachycardia     syncopal  treated via ICD  . Chicken pox   . Heart disease   . Elevated blood pressure reading   . Hyperlipidemia   . Kidney stone   . Gout     Past Surgical History  Procedure Laterality Date  . Insert / replace / remove pacemaker      Implantable cardioverter-defibrillator as part of #1-[employer]ant Vitality T175  . Cholecystectomy    . Appendectomy  1955  . Heart attack  1991  . Knee surgery  2005  . Implantable cardioverter defibrillator implant      Current Outpatient Prescriptions  Medication Sig Dispense Refill  . aspirin 81 MG tablet Take 81 mg by mouth daily.        . cholecalciferol (VITAMIN D) 400 UNITS TABS Take by mouth.      . enalapril (VASOTEC) 20 MG tablet Take 1/2 tablet twice a day  90 tablet  3  . furosemide (LASIX) 40 MG tablet Take 1 tablet (40 mg total) by mouth daily.  30 tablet  0  . ibuprofen (ADVIL,MOTRIN) 800 MG tablet Take 1 tablet every 8 hours for inflammation. Take for  10 days. Take with food.  30 tablet  0  . magnesium oxide (MAG-OX) 400 MG tablet Take 400 mg by mouth every other day.      . metoprolol (LOPRESSOR) 100 MG tablet Take 1 tablet (100 mg total) by mouth 2 (two) times daily.  180 tablet  3  . multivitamin (THERAGRAN) per tablet Take 1 tablet by mouth daily.        . niacin 500 MG CR capsule Take 1,500 mg by mouth 3 (three) times daily.        . Omega-3 Fatty Acids (FISH OIL) 1000 MG CAPS Take 1,000 mg by mouth every other day.       . potassium chloride SA (K-DUR,KLOR-CON) 20 MEQ tablet Take 20 mEq by mouth every other day.        . vitamin E 400 UNIT capsule Take 400 Units by mouth as needed.        No current facility-administered medications for this visit.    No Known Allergies  Review of Systems negative except from HPI and PMH  Physical Exam BP 100/60  Pulse 68  Ht 5\' 10"  (1.778 m)  Wt 276 lb (125.193 kg)  BMI 39.6 kg/m2 Well developed and well nourished  in no acute distress HENT normal E scleral and icterus clear Neck Supple JVP flat; carotids brisk and full Clear to ausculation Device pocket well healed; without hematoma or erythema.  There is no tethering for   aRegular rate and rhythm, 2/6 early systolic murmurSoft with active bowel sounds No clubbing cyanosis 3+L>R Edema Alert and oriented, grossly normal motor and sensory function Skin Warm and Dry  ECG demonstrates sinus rhythm at 68 with occasional PVCs Intervals 15/16/44 with right bundle branch block and right axis deviation.  Assessment and  Plan

## 2013-07-18 NOTE — Assessment & Plan Note (Signed)
Stable on current medications 

## 2013-07-18 NOTE — Assessment & Plan Note (Signed)
I don't hear a murmur today. Given the fact that he has to take a day of work to come get the echo and it was canceled to his visit today we will postpone the echo until his next cardiology office visit

## 2013-07-18 NOTE — Assessment & Plan Note (Signed)
The patient's device was interrogated.  The information was reviewed. No changes were made in the programming.    

## 2013-07-18 NOTE — Patient Instructions (Addendum)
Remote monitoring is used to monitor your Pacemaker of ICD from home. This monitoring reduces the number of office visits required to check your device to one time per year. It allows [place] to keep an eye on the functioning of your device to ensure it is working properly. You are scheduled for a device check from home on November 28. You may send your transmission at any time that day. If you have a wireless device, the transmission will be sent automatically. After your physician reviews your transmission, you will receive a postcard with your next transmission date.  Your physician wants you to follow-up in: 1 YEAR.  You will receive a reminder letter in the mail two months in advance. If you don't receive a letter, please call our office to schedule the follow-up appointment.  Your physician recommends that you continue on your current medications as directed. Please refer to the Current Medication list given to you today.

## 2013-07-18 NOTE — Assessment & Plan Note (Signed)
The patient has had an episode of ventricular tachycardia February 2014 terminated with antitachycardia pacing

## 2013-07-22 ENCOUNTER — Ambulatory Visit: Payer: Self-pay | Admitting: Internal Medicine

## 2013-07-23 ENCOUNTER — Other Ambulatory Visit: Payer: BC Managed Care – PPO

## 2013-07-30 LAB — BASIC METABOLIC PANEL: Creatinine: 1.3 mg/dL (ref 0.6–1.3)

## 2013-08-02 ENCOUNTER — Encounter: Payer: Self-pay | Admitting: Internal Medicine

## 2013-09-13 ENCOUNTER — Telehealth: Payer: Self-pay | Admitting: *Deleted

## 2013-09-13 NOTE — Telephone Encounter (Signed)
Made appt for pt to see Billy [name] 09/27/13 to discuss generator change.

## 2013-09-13 NOTE — Telephone Encounter (Signed)
LMOVM to return my call. Re: ICD battery reached ERI on 09/10/13.

## 2013-09-16 ENCOUNTER — Encounter: Payer: Self-pay | Admitting: Internal Medicine

## 2013-09-16 ENCOUNTER — Ambulatory Visit (INDEPENDENT_AMBULATORY_CARE_PROVIDER_SITE_OTHER): Payer: BC Managed Care – PPO | Admitting: Internal Medicine

## 2013-09-16 VITALS — BP 122/62 | HR 64 | Temp 98.1°F | Resp 12 | Ht 68.0 in | Wt 276.8 lb

## 2013-09-16 DIAGNOSIS — D696 Thrombocytopenia, unspecified: Secondary | ICD-10-CM

## 2013-09-16 DIAGNOSIS — Z23 Encounter for immunization: Secondary | ICD-10-CM

## 2013-09-16 DIAGNOSIS — E669 Obesity, unspecified: Secondary | ICD-10-CM

## 2013-09-16 DIAGNOSIS — I10 Essential (primary) hypertension: Secondary | ICD-10-CM

## 2013-09-16 MED ORDER — FUROSEMIDE 40 MG PO TABS: 40.0000 mg | ORAL_TABLET | Freq: Every day | ORAL | Status: DC

## 2013-09-16 MED ORDER — ZOSTER VACCINE LIVE 19400 UNT/0.65ML ~~LOC~~ SOLR: 0.6500 mL | Freq: Once | SUBCUTANEOUS | Status: DC

## 2013-09-16 NOTE — Progress Notes (Signed)
Patient ID: Billy [name], male   DOB: Jun 03, 1951, 62 y.o.   MRN: [id number]   Patient Active Problem List   Diagnosis Date Noted  . Anemia 04/29/2013  . Thrombocytopenia, unspecified 04/29/2013  . Murmur, diastolic 04/29/2013  . Hypomagnesemia 10/26/2012  . Obesity (BMI 30-39.9) 10/26/2012  .  implantable   defibrillator Single chamber [employer] [phone] 07/12/2011  . Chronic systolic congestive heart failure class 2 07/12/2011  . PERIPHERAL EDEMA 05/28/2010  . VENTRICULAR TACHYCARDIA 05/28/2009  . HYPERTENSION, BENIGN 05/21/2009  . CARDIOMYOPATHY, ISCHEMIC 05/21/2009  . BUNDLE BRANCH BLOCK, LEFT 05/21/2009    Subjective:  CC:   Chief Complaint  Patient presents with  . Follow-up    HPI:   Billy [name] a 62 y.o. male who presents Followup on chronic conditions including morbid obesity, hypertension, and coronary artery disease with remote history of AMI and cardiac arrest status post implantable defibrillator.Marland Kitchen He is followed by Dr.  Graciela Husbands for his arrhythmias. He has not had any hospitalizations since his last visit. He was referred to Dr. Sherrlyn Hock for evaluation of thrombocytopenia and had a thorough workup He has gained some weight and is not exercising regularly due to joint pain.   Past Medical History  Diagnosis Date  . Ischemic cardiomyopathy   . LBBB (left bundle branch block)   . Obesity   . Hypertension   . Dyslipidemia   . Dual implantable cardiac defibrillator in situ     MADIT-CRT trial  . Ventricular tachycardia     syncopal  treated via ICD  . Chicken pox   . Heart disease   . Elevated blood pressure reading   . Hyperlipidemia   . Kidney stone   . Gout     Past Surgical History  Procedure Laterality Date  . Insert / replace / remove pacemaker      Implantable cardioverter-defibrillator as part of #1-Guidant Vitality T175  . Cholecystectomy    . Appendectomy  1955  . Heart attack  1991  . Knee surgery  2005  . Implantable cardioverter  defibrillator implant         The following portions of the patient's history were reviewed and updated as appropriate: Allergies, current medications, and problem list.    Review of Systems:   12 Pt  review of systems was negative except those addressed in the HPI,     History   Social History  . Marital Status: Married    Spouse Name: N/A    Number of Children: N/A  . Years of Education: N/A   Occupational History  . Not on file.   Social History Main Topics  . Smoking status: Former Smoker -- 1.00 packs/day for 30 years    Types: Cigarettes    Quit date: 06/08/1990  . Smokeless tobacco: Not on file     Comment: Tobacco use-no  . Alcohol Use: 1.5 oz/week    3 drink(s) per week     Comment: Occasional  . Drug Use: No  . Sexual Activity: Not on file   Other Topics Concern  . Not on file   Social History Narrative   Gets regular exercise.    Objective:  Filed Vitals:   09/16/13 1539  BP: 122/62  Pulse: 64  Temp: 98.1 F (36.7 C)  Resp: 12     General appearance: alert, cooperative and appears stated age Ears: normal TM's and external ear canals both ears Throat: lips, mucosa, and tongue normal; teeth and gums normal Neck: no  adenopathy, no carotid bruit, supple, symmetrical, trachea midline and thyroid not enlarged, symmetric, no tenderness/mass/nodules Back: symmetric, no curvature. ROM normal. No CVA tenderness. Lungs: clear to auscultation bilaterally Heart: regular rate and rhythm, S1, S2 normal, no murmur, click, rub or gallop Abdomen: soft, non-tender; bowel sounds normal; no masses,  no organomegaly Pulses: 2+ and symmetric Skin: Skin color, texture, turgor normal. No rashes or lesions Lymph nodes: Cervical, supraclavicular, and axillary nodes normal.  Assessment and Plan:  Thrombocytopenia, unspecified He was referred to hematology in June for thrombo cytopenia.  He had a comprehensive workup including ultrasound of his spleen. He says  that he stillpaying the medical bills associated with the  workup. Records requested. He has not had any bleeding events. He has not been placed on steroids.  Obesity (BMI 30-39.9) I have addressed  BMI and recommended wt loss of 10% of body weigh over the next 6 months using a low glycemic index diet and regular exercise a minimum of 5 days per week.    HYPERTENSION, BENIGN Well controlled on current regimen. Renal function was not checked today since his creatinine was checked in August by Dr. Sherrlyn Hock.  no changes today.  Need for prophylactic vaccination and inoculation against influenza He was strongly advised to receive the influenza vaccine today but refused.   Updated Medication List Outpatient Encounter Prescriptions as of 09/16/2013  Medication Sig Dispense Refill  . aspirin 81 MG tablet Take 81 mg by mouth daily.        . cholecalciferol (VITAMIN D) 400 UNITS TABS Take by mouth.      . enalapril (VASOTEC) 20 MG tablet Take 1/2 tablet twice a day  90 tablet  3  . furosemide (LASIX) 40 MG tablet Take 1 tablet (40 mg total) by mouth daily.  90 tablet  1  . ibuprofen (ADVIL,MOTRIN) 800 MG tablet Take 1 tablet every 8 hours for inflammation. Take for 10 days. Take with food.  30 tablet  0  . magnesium oxide (MAG-OX) 400 MG tablet Take 400 mg by mouth every other day.      . metoprolol (LOPRESSOR) 100 MG tablet Take 1 tablet (100 mg total) by mouth 2 (two) times daily.  180 tablet  3  . multivitamin (THERAGRAN) per tablet Take 1 tablet by mouth daily.        . niacin 500 MG CR capsule Take 1,500 mg by mouth 3 (three) times daily.        . Omega-3 Fatty Acids (FISH OIL) 1000 MG CAPS Take 1,000 mg by mouth every other day.       . potassium chloride SA (K-DUR,KLOR-CON) 20 MEQ tablet Take 20 mEq by mouth every other day.        . vitamin E 400 UNIT capsule Take 400 Units by mouth as needed.       . [DISCONTINUED] furosemide (LASIX) 40 MG tablet Take 1 tablet (40 mg total) by mouth daily.   30 tablet  0  . zoster vaccine live, PF, (ZOSTAVAX) 16109 UNT/0.65ML injection Inject 19,400 Units into the skin once.  1 each  0   No facility-administered encounter medications on file as of 09/16/2013.

## 2013-09-16 NOTE — Patient Instructions (Signed)
You can use 600 mg ibuprofen and 500 mg tylenol every 6 hours   A;lso try icing the knee  This is  One version of a  "Low GI"  Diet:  It will still lower your blood sugars and allow you to lose 4 to 8  lbs  per month if you follow it carefully.  Your goal with exercise is a minimum of 30 minutes of aerobic exercise 5 days per week (Walking does not count once it becomes easy!)    All of the foods can be found at grocery stores and in bulk at [employer].  The Atkins protein bars and shakes are available in more varieties at [employer], [employer] and [employer].     7 AM Breakfast:  Choose from the following:  Low carbohydrate Protein  Shakes (I recommend the [employer]AS AdvantEdge "Carb Control" shakes  Or the low carb shakes by Atkins.    2.5 carbs   Arnold's "Sandwhich Thin"toasted  w/ peanut butter (no jelly: about 20 net carbs  "Bagel Thin" with cream cheese and salmon: about 20 carbs   a scrambled egg/bacon/cheese burrito made with Mission's "carb balance" whole wheat tortilla  (about 10 net carbs )   Avoid cereal and bananas, oatmeal and cream of wheat and grits. They are loaded with carbohydrates!   10 AM: high protein snack  Protein bar by Atkins (the snack size, under 200 cal, usually < 6 net carbs).    A stick of cheese:  Around 1 carb,  100 cal     Dannon Light n Fit [place] Yogurt  (80 cal, 8 carbs)  Other so called "protein bars" and Greek yogurts tend to be loaded with carbohydrates.  Remember, in food advertising, the word "energy" is synonymous for " carbohydrate."  Lunch:   A Sandwich using the bread choices listed, Can use any  Eggs,  lunchmeat, grilled meat or canned tuna), avocado, regular mayo/mustard  and cheese.  A Salad using blue cheese, ranch,  Goddess or vinagrette,  No croutons or "confetti" and no "candied nuts" but regular nuts OK.   No pretzels or chips.  Pickles and miniature sweet peppers are a good low carb alternative that provide a "crunch"  The bread is the only  source of carbohydrate in a sandwich and  can be decreased by trying some of these alternatives to traditional loaf bread  Joseph's makes a pita bread and a flat bread that are 50 cal and 4 net carbs available at BJs and [employer]Mart.  This can be toasted to use with hummous as well  Toufayan makes a low carb flatbread that's 100 cal and 9 net carbs available at [employer] and [employer] makes 2 sizes of  Low carb whole wheat tortilla  (The large one is 210 cal and 6 net carbs) Avoid "Low fat dressings, as well as Reyne Dumas and [address] dressings They are loaded with sugar!   3 PM/ Mid day  Snack:  Consider  1 ounce of  almonds, walnuts, pistachios, pecans, peanuts,  Macadamia nuts or a nut medley.  Avoid "granola"; the dried cranberries and raisins are loaded with carbohydrates. Mixed nuts as long as there are no raisins,  cranberries or dried fruit.     6 PM  Dinner:     Meat/fowl/fish with a green salad, and either broccoli, cauliflower, green beans, spinach, brussel sprouts or  Lima beans. DO NOT BREAD THE PROTEIN!!      There is a low  carb pasta by Dreamfield's that is acceptable and tastes great: only 5 digestible carbs/serving.( All grocery stores but BJs carry it )  Try Kai Levins Angelo's chicken piccata or chicken or eggplant parm over low carb pasta.(Lowes and BJs)   Clifton Custard Sanchez's "Carnitas" (pulled pork, no sauce,  0 carbs) or his beef pot roast to make a dinner burrito (at [employer]J's)  Pesto over low carb pasta (bj's sells a good quality pesto in the center refrigerated section of the deli   Whole wheat pasta is still full of digestible carbs and  Not as low in glycemic index as Dreamfield's.   Brown rice is still rice,  So skip the rice and noodles if you eat [place] or [place] (or at least limit to 1/2 cup)  9 PM snack :   Breyer's "low carb" fudgsicle or  ice cream bar (Carb Smart line), or  Weight Watcher's ice cream bar , or another "no sugar added" ice cream;  a serving of fresh  berries/cherries with whipped cream   Cheese or DANNON'S LlGHT [place] YOGURT  Avoid bananas, pineapple, grapes  and watermelon on a regular basis because they are high in sugar.  THINK OF THEM AS DESSERT  Remember that snack Substitutions should be less than 10 NET carbs per serving and meals < 20 carbs. Remember to subtract fiber grams to get the "net carbs."

## 2013-09-17 ENCOUNTER — Ambulatory Visit: Payer: Self-pay | Admitting: Internal Medicine

## 2013-09-17 DIAGNOSIS — Z23 Encounter for immunization: Secondary | ICD-10-CM | POA: Insufficient documentation

## 2013-09-17 NOTE — Assessment & Plan Note (Signed)
He was strongly advised to receive the influenza vaccine today but refused.

## 2013-09-17 NOTE — Assessment & Plan Note (Signed)
Well controlled on current regimen. Renal function was not checked today since his creatinine was checked in August by Dr. Sherrlyn Hock.  no changes today.

## 2013-09-17 NOTE — Assessment & Plan Note (Signed)
I have addressed  BMI and recommended wt loss of 10% of body weigh over the next 6 months using a low glycemic index diet and regular exercise a minimum of 5 days per week.   

## 2013-09-17 NOTE — Assessment & Plan Note (Signed)
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  He had a comprehensive workup including ultrasound of his spleen. He says that he stillpaying the medical bills associated with the  workup. Records requested. He has not had any bleeding events. He has not been placed on steroids.   

## 2013-09-18 LAB — CBC CANCER CENTER
Basophil %: 0.6 %
Eosinophil #: 0.2 x10 3/mm (ref 0.0–0.7)
Eosinophil %: 3.5 %
HGB: 12.5 g/dL — ABNORMAL LOW (ref 13.0–18.0)
Lymphocyte #: 2.2 x10 3/mm (ref 1.0–3.6)
MCV: 97 fL (ref 80–100)
Monocyte #: 0.8 x10 3/mm (ref 0.2–1.0)
Neutrophil #: 2.4 x10 3/mm (ref 1.4–6.5)
Neutrophil %: 43.1 %
Platelet: 134 x10 3/mm — ABNORMAL LOW (ref 150–440)
RBC: 3.89 10*6/uL — ABNORMAL LOW (ref 4.40–5.90)
WBC: 5.6 x10 3/mm (ref 3.8–10.6)

## 2013-09-18 LAB — LACTATE DEHYDROGENASE: LDH: 310 U/L — ABNORMAL HIGH (ref 85–241)

## 2013-09-21 ENCOUNTER — Ambulatory Visit: Payer: Self-pay | Admitting: Internal Medicine

## 2013-09-27 ENCOUNTER — Encounter: Payer: Self-pay | Admitting: *Deleted

## 2013-09-27 ENCOUNTER — Ambulatory Visit (INDEPENDENT_AMBULATORY_CARE_PROVIDER_SITE_OTHER): Payer: BC Managed Care – PPO | Admitting: Cardiology

## 2013-09-27 ENCOUNTER — Encounter: Payer: Self-pay | Admitting: Cardiology

## 2013-09-27 VITALS — BP 100/64 | HR 56 | Ht 68.0 in | Wt 275.0 lb

## 2013-09-27 DIAGNOSIS — I5022 Chronic systolic (congestive) heart failure: Secondary | ICD-10-CM

## 2013-09-27 DIAGNOSIS — Z4502 Encounter for adjustment and management of automatic implantable cardiac defibrillator: Secondary | ICD-10-CM

## 2013-09-27 DIAGNOSIS — Z95810 Presence of automatic (implantable) cardiac defibrillator: Secondary | ICD-10-CM

## 2013-09-27 DIAGNOSIS — I472 Ventricular tachycardia: Secondary | ICD-10-CM

## 2013-09-27 DIAGNOSIS — I255 Ischemic cardiomyopathy: Secondary | ICD-10-CM

## 2013-09-27 DIAGNOSIS — I509 Heart failure, unspecified: Secondary | ICD-10-CM

## 2013-09-27 DIAGNOSIS — I2589 Other forms of chronic ischemic heart disease: Secondary | ICD-10-CM

## 2013-09-27 LAB — MDC_IDC_ENUM_SESS_TYPE_INCLINIC
Battery Voltage: 2.5 V
Brady Statistic RV Percent Paced: 0 %
Implantable Pulse Generator Serial Number: 125791
Lead Channel Impedance Value: 494 Ohm
Lead Channel Pacing Threshold Amplitude: 0.6 V
Lead Channel Sensing Intrinsic Amplitude: 14.1 mV
Zone Setting Detection Interval: 285.71 ms

## 2013-09-27 NOTE — Patient Instructions (Addendum)
Your physician has recommended that you have a BI-V defibrillator inserted October 16, 2013 AT 10:30 AM. ARRIVE AT 8:30 AM. An implantable cardioverter defibrillator (ICD) is a small device that is placed in your chest or, in rare cases, your abdomen. This device uses electrical pulses or shocks to help control life-threatening, irregular heartbeats that could lead the heart to suddenly stop beating (sudden cardiac arrest). Leads are attached to the ICD that goes into your heart. This is done in the hospital and usually requires an overnight stay. Please see the instruction sheet given to you today for more information.  Your physician has requested that you have an echocardiogram October 15, 2013 AT 3:30 PM AT THE [facility] OFFICE. Echocardiography is a painless test that uses sound waves to create images of your heart. It provides your doctor with information about the size and shape of your heart and how well your heart's chambers and valves are working. This procedure takes approximately one hour. There are no restrictions for this procedure.  Your physician recommends that you return for lab work in: CBC,BMET (October 15, 2013 AT 4:00 PM)  Your physician has recommended you make the following change in your medication:   HOLD YOUR LASIX THE MORNING OF PROCEDURE  Your physician recommends that you continue on your current medications as directed. Please refer to the Current Medication list given to you today.

## 2013-09-30 ENCOUNTER — Encounter (HOSPITAL_COMMUNITY): Payer: Self-pay | Admitting: Internal Medicine

## 2013-09-30 ENCOUNTER — Other Ambulatory Visit: Payer: BC Managed Care – PPO

## 2013-10-03 ENCOUNTER — Encounter: Payer: Self-pay | Admitting: Internal Medicine

## 2013-10-03 NOTE — Progress Notes (Signed)
ELECTROPHYSIOLOGY OFFICE NOTE  Patient ID: Billy [name] MRN: [id number], DOB/AGE: [birth year]   Date of Visit: 10/03/2013  Primary Physician: Duncan Dull, MD  Primary Cardiologist: Berton Mount, MD Reason for Visit: EP/device follow-up  History of Present Illness  Billy [name] is a 62 y.o. male with an ischemic CM s/p single chamber ICD implant as part of MADIT-CRT protocol in 2008, VT with syncope and ICD shocks in 2011, chronic systolic HF, LBBB, CAD s/p lateral wall MI 1991, HTN and dyslipidemia who presents today for routine electrophysiology followup. He is accompanied by his wife. His ICD battery is at [facility].  Since last being seen in our clinic, he reports he is doing well and has no complaints. He remains quite active. He has chronic DOE with moderate exertion but denies worsening. He can walk approximately 2 blocks without much difficulty. He notices DOE when walking up stairs. He denies chest pain. He denies palpitations, dizziness, near syncope or syncope. He denies LE swelling, orthopnea, PND or recent weight gain. He denies ICD shocks. He is compliant and tolerating medications without difficulty.  Past Medical History Past Medical History  Diagnosis Date  . Ischemic cardiomyopathy   . LBBB (left bundle branch block)   . Obesity   . Hypertension   . Dyslipidemia   . Dual implantable cardiac defibrillator in situ     MADIT-CRT trial  . Ventricular tachycardia     syncopal  treated via ICD  . Chicken pox   . Heart disease   . Elevated blood pressure reading   . Hyperlipidemia   . Kidney stone   . Gout     Past Surgical History Past Surgical History  Procedure Laterality Date  . Insert / replace / remove pacemaker      Implantable cardioverter-defibrillator as part of #1-Guidant Vitality T175  . Cholecystectomy    . Appendectomy  1955  . Heart attack  1991  . Knee surgery  2005  . Implantable cardioverter defibrillator implant       Allergies/Intolerances No Known Allergies  Current Home Medications Current Outpatient Prescriptions  Medication Sig Dispense Refill  . aspirin 81 MG tablet Take 81 mg by mouth daily.        . cholecalciferol (VITAMIN D) 400 UNITS TABS Take by mouth.      . enalapril (VASOTEC) 20 MG tablet Take 1/2 tablet twice a day  90 tablet  3  . furosemide (LASIX) 40 MG tablet Take 1 tablet (40 mg total) by mouth daily.  90 tablet  1  . ibuprofen (ADVIL,MOTRIN) 800 MG tablet Take 1 tablet every 8 hours for inflammation. Take for 10 days. Take with food.  30 tablet  0  . magnesium oxide (MAG-OX) 400 MG tablet Take 400 mg by mouth every other day.      . metoprolol (LOPRESSOR) 100 MG tablet Take 1 tablet (100 mg total) by mouth 2 (two) times daily.  180 tablet  3  . multivitamin (THERAGRAN) per tablet Take 1 tablet by mouth daily.        . niacin 500 MG CR capsule Take 1,500 mg by mouth 3 (three) times daily.        . Omega-3 Fatty Acids (FISH OIL) 1000 MG CAPS Take 1,000 mg by mouth every other day.       . potassium chloride SA (K-DUR,KLOR-CON) 20 MEQ tablet Take 20 mEq by mouth every other day.        . vitamin E 400 UNIT  capsule Take 400 Units by mouth as needed.        No current facility-administered medications for this visit.    Social History Social History  . Marital Status: Married   Social History Main Topics  . Smoking status: Former Smoker -- 1.00 packs/day for 30 years    Types: Cigarettes    Quit date: 06/08/1990  . Smokeless tobacco: No     Comment: Tobacco use-no  . Alcohol Use: 1.5 oz/week    3 drink(s) per week     Comment: Occasional  . Drug Use: No   Review of Systems General: No chills, fever, night sweats or weight changes Cardiovascular: No chest pain, dyspnea on exertion, edema, orthopnea, palpitations, paroxysmal nocturnal dyspnea Dermatological: No rash, lesions or masses Respiratory: No cough, dyspnea Urologic: No hematuria, dysuria Abdominal: No nausea,  vomiting, diarrhea, bright red blood per rectum, melena, or hematemesis Neurologic: No visual changes, weakness, changes in mental status All other systems reviewed and are otherwise negative except as noted above.  Physical Exam Vitals: Blood pressure 100/64, pulse 56, height 5\' 8"  (1.727 m), weight 275 lb (124.739 kg), SpO2 98.00%.  General: Well developed, well appearing 62 y.o. male in no acute distress. HEENT: Normocephalic, atraumatic. EOMs intact. Sclera nonicteric. Oropharynx clear.  Neck: Supple without bruits. No JVD. Lungs: Respirations regular and unlabored, CTA bilaterally. No wheezes, rales or rhonchi. Heart: RRR. S1, S2 present. No murmurs, rub, S3 or S4. Abdomen: Soft, non-tender, non-distended. BS present x 4 quadrants. No hepatosplenomegaly.  Extremities: No clubbing, cyanosis or edema. DP/PT/Radials 2+ and equal bilaterally. Psych: Normal affect. Neuro: Alert and oriented X 3. Moves all extremities spontaneously. Skin: Left chest / implant site well healed.   Diagnostics 12-lead ECG from August 2014 - SR at 68 bpm, one PVC; IVCD with QRS duration 158 msec Device interrogation - ICD battery at [facility] otherwise stable lead measurements; no episodes / arrhythmias; no programming changes made; see PaceArt report for full details  Assessment and Plan 1. ICD battery at [facility] 2. Ischemic CM s/p ICD implant (enrolled in MADIT-CRT trial) 3. Chronic systolic HF 4. Paroxysmal VT 5. CAD  Billy [name]'s ICD battery is at [facility]. Discussed the need for ICD generator change and upgrade to CRT-D which he tells me Dr. Graciela Husbands has recommended for him in the past. He is expected to receive benefit from CRT. I will order an echo to evaluate his LV function in addition to 12-lead ECG to assess QRS duration (last ECG showed IVCD, duration 158 msec). Risks, benefits and alternatives to Bi-V ICD implant were discussed in detail with the patient today. These risks include, but are not limited to,  bleeding, infection, pneumothorax, perforation, tamponade, vascular damage, lead dislodgement, renal failure, MI, stroke and death. Mr. Buhl expressed verbal understanding and agrees to proceed. I will confer with Dr. Graciela Husbands but have tentatively scheduled his procedure for 10/16/2013.   Limmie Patricia, PA-C 10/03/2013, 11:32 AM  ADDENDUM - 10/10/2013 9:42 AM I spoke with Dr. Graciela Husbands who agrees with the plan of care as outlined above.

## 2013-10-04 ENCOUNTER — Encounter (HOSPITAL_COMMUNITY): Payer: Self-pay | Admitting: Pharmacy Technician

## 2013-10-11 ENCOUNTER — Other Ambulatory Visit: Payer: BC Managed Care – PPO

## 2013-10-14 ENCOUNTER — Other Ambulatory Visit: Payer: BC Managed Care – PPO

## 2013-10-15 ENCOUNTER — Ambulatory Visit (INDEPENDENT_AMBULATORY_CARE_PROVIDER_SITE_OTHER): Payer: BC Managed Care – PPO | Admitting: *Deleted

## 2013-10-15 ENCOUNTER — Encounter (INDEPENDENT_AMBULATORY_CARE_PROVIDER_SITE_OTHER): Payer: BC Managed Care – PPO | Admitting: Cardiology

## 2013-10-15 ENCOUNTER — Ambulatory Visit (INDEPENDENT_AMBULATORY_CARE_PROVIDER_SITE_OTHER): Payer: BC Managed Care – PPO | Admitting: Cardiology

## 2013-10-15 VITALS — BP 112/70 | HR 71 | Ht 69.0 in | Wt 275.0 lb

## 2013-10-15 DIAGNOSIS — R011 Cardiac murmur, unspecified: Secondary | ICD-10-CM

## 2013-10-15 DIAGNOSIS — I2510 Atherosclerotic heart disease of native coronary artery without angina pectoris: Secondary | ICD-10-CM

## 2013-10-15 DIAGNOSIS — I447 Left bundle-branch block, unspecified: Secondary | ICD-10-CM

## 2013-10-15 DIAGNOSIS — R229 Localized swelling, mass and lump, unspecified: Secondary | ICD-10-CM

## 2013-10-15 DIAGNOSIS — I2589 Other forms of chronic ischemic heart disease: Secondary | ICD-10-CM

## 2013-10-15 DIAGNOSIS — I472 Ventricular tachycardia: Secondary | ICD-10-CM

## 2013-10-15 DIAGNOSIS — Z01810 Encounter for preprocedural cardiovascular examination: Secondary | ICD-10-CM

## 2013-10-15 MED ORDER — DEXTROSE 5 % IV SOLN
3.0000 g | Freq: Once | INTRAVENOUS | Status: DC
  Filled 2013-10-15: qty 3000

## 2013-10-15 MED ORDER — SODIUM CHLORIDE 0.9 % IR SOLN
80.0000 mg | Status: DC
  Filled 2013-10-15: qty 2

## 2013-10-16 ENCOUNTER — Encounter (HOSPITAL_COMMUNITY): Payer: Self-pay | Admitting: *Deleted

## 2013-10-16 ENCOUNTER — Encounter (HOSPITAL_COMMUNITY)
Admission: RE | Disposition: A | Discharge status: Home / Self Care | Payer: Self-pay | Source: Ambulatory Visit | Attending: Internal Medicine

## 2013-10-16 ENCOUNTER — Ambulatory Visit (HOSPITAL_COMMUNITY)
Admission: RE | Admit: 2013-10-16 | Discharge: 2013-10-17 | Disposition: A | Discharge status: Home / Self Care | Payer: BC Managed Care – PPO | Source: Ambulatory Visit | Attending: Internal Medicine | Admitting: Internal Medicine

## 2013-10-16 DIAGNOSIS — I472 Ventricular tachycardia, unspecified: Secondary | ICD-10-CM | POA: Insufficient documentation

## 2013-10-16 DIAGNOSIS — I2589 Other forms of chronic ischemic heart disease: Secondary | ICD-10-CM

## 2013-10-16 DIAGNOSIS — I4729 Other ventricular tachycardia: Secondary | ICD-10-CM | POA: Insufficient documentation

## 2013-10-16 DIAGNOSIS — I5022 Chronic systolic (congestive) heart failure: Secondary | ICD-10-CM | POA: Insufficient documentation

## 2013-10-16 DIAGNOSIS — I10 Essential (primary) hypertension: Secondary | ICD-10-CM | POA: Insufficient documentation

## 2013-10-16 DIAGNOSIS — I2510 Atherosclerotic heart disease of native coronary artery without angina pectoris: Secondary | ICD-10-CM | POA: Insufficient documentation

## 2013-10-16 DIAGNOSIS — I255 Ischemic cardiomyopathy: Secondary | ICD-10-CM | POA: Diagnosis present

## 2013-10-16 DIAGNOSIS — M109 Gout, unspecified: Secondary | ICD-10-CM | POA: Insufficient documentation

## 2013-10-16 DIAGNOSIS — I509 Heart failure, unspecified: Secondary | ICD-10-CM

## 2013-10-16 DIAGNOSIS — Z4502 Encounter for adjustment and management of automatic implantable cardiac defibrillator: Secondary | ICD-10-CM | POA: Insufficient documentation

## 2013-10-16 DIAGNOSIS — I447 Left bundle-branch block, unspecified: Secondary | ICD-10-CM | POA: Insufficient documentation

## 2013-10-16 DIAGNOSIS — Z006 Encounter for examination for normal comparison and control in clinical research program: Secondary | ICD-10-CM | POA: Insufficient documentation

## 2013-10-16 DIAGNOSIS — N200 Calculus of kidney: Secondary | ICD-10-CM | POA: Insufficient documentation

## 2013-10-16 DIAGNOSIS — I252 Old myocardial infarction: Secondary | ICD-10-CM | POA: Insufficient documentation

## 2013-10-16 DIAGNOSIS — E785 Hyperlipidemia, unspecified: Secondary | ICD-10-CM | POA: Insufficient documentation

## 2013-10-16 DIAGNOSIS — E669 Obesity, unspecified: Secondary | ICD-10-CM | POA: Insufficient documentation

## 2013-10-16 HISTORY — PX: ICD GENERATOR CHANGE: SHX5854

## 2013-10-16 HISTORY — PX: BIV ICD GENERTAOR CHANGE OUT: SHX5745

## 2013-10-16 HISTORY — DX: Acute myocardial infarction, unspecified: I21.9

## 2013-10-16 HISTORY — DX: Unspecified osteoarthritis, unspecified site: M19.90

## 2013-10-16 HISTORY — PX: BI-VENTRICULAR IMPLANTABLE CARDIOVERTER DEFIBRILLATOR UPGRADE: SHX5749

## 2013-10-16 HISTORY — DX: Presence of automatic (implantable) cardiac defibrillator: Z95.810

## 2013-10-16 LAB — BASIC METABOLIC PANEL
BUN/Creatinine Ratio: 20 (ref 10–22)
BUN: 23 mg/dL (ref 8–27)
Chloride: 103 mmol/L (ref 97–108)
Creatinine, Ser: 1.17 mg/dL (ref 0.76–1.27)
GFR calc Af Amer: 77 mL/min/{1.73_m2} (ref >59)
GFR calc non Af Amer: 66 mL/min/{1.73_m2} (ref >59)

## 2013-10-16 LAB — CBC WITH DIFFERENTIAL/PLATELET
Basos: 0 %
Eosinophils Absolute: 0.2 10*3/uL (ref 0.0–0.4)
HCT: 33.1 % — ABNORMAL LOW (ref 37.5–51.0)
Hemoglobin: 11.5 g/dL — ABNORMAL LOW (ref 12.6–17.7)
Lymphocytes Absolute: 2 10*3/uL (ref 0.7–3.1)
Lymphs: 38 %
MCH: 32.4 pg (ref 26.6–33.0)
MCV: 93 fL (ref 79–97)
Monocytes Absolute: 0.6 10*3/uL (ref 0.1–0.9)
Neutrophils Absolute: 2.3 10*3/uL (ref 1.4–7.0)
RBC: 3.55 x10E6/uL — ABNORMAL LOW (ref 4.14–5.80)
WBC: 5.1 10*3/uL (ref 3.4–10.8)

## 2013-10-16 SURGERY — BIV ICD GENERTAOR CHANGE OUT
Anesthesia: LOCAL

## 2013-10-16 MED ORDER — MUPIROCIN 2 % EX OINT
TOPICAL_OINTMENT | CUTANEOUS | Status: AC
  Administered 2013-10-16: 09:00:00
  Filled 2013-10-16: qty 22

## 2013-10-16 MED ORDER — SODIUM CHLORIDE 0.9 % IV SOLN
INTRAVENOUS | Status: AC
  Administered 2013-10-16: 13:00:00 via INTRAVENOUS

## 2013-10-16 MED ORDER — LIDOCAINE HCL (PF) 1 % IJ SOLN
INTRAMUSCULAR | Status: AC
  Filled 2013-10-16: qty 30

## 2013-10-16 MED ORDER — HEPARIN (PORCINE) IN NACL 2-0.9 UNIT/ML-% IJ SOLN
INTRAMUSCULAR | Status: AC
  Filled 2013-10-16: qty 500

## 2013-10-16 MED ORDER — CEFAZOLIN SODIUM 1-5 GM-% IV SOLN
1.0000 g | Freq: Four times a day (QID) | INTRAVENOUS | Status: AC
  Administered 2013-10-16 – 2013-10-17 (×3): 1 g via INTRAVENOUS
  Filled 2013-10-16 (×3): qty 50

## 2013-10-16 MED ORDER — CHLORHEXIDINE GLUCONATE 4 % EX LIQD
60.0000 mL | Freq: Once | CUTANEOUS | Status: DC
  Filled 2013-10-16: qty 60

## 2013-10-16 MED ORDER — POTASSIUM CHLORIDE CRYS ER 20 MEQ PO TBCR
20.0000 meq | EXTENDED_RELEASE_TABLET | ORAL | Status: DC
  Administered 2013-10-16: 20 meq via ORAL
  Filled 2013-10-16: qty 1

## 2013-10-16 MED ORDER — FUROSEMIDE 40 MG PO TABS: 40.0000 mg | ORAL_TABLET | Freq: Every day | ORAL | Status: DC

## 2013-10-16 MED ORDER — ACETAMINOPHEN 325 MG PO TABS: 325.0000 mg | ORAL_TABLET | ORAL | Status: DC | PRN

## 2013-10-16 MED ORDER — FUROSEMIDE 10 MG/ML IJ SOLN
80.0000 mg | Freq: Four times a day (QID) | INTRAMUSCULAR | Status: AC
  Administered 2013-10-16 – 2013-10-17 (×3): 80 mg via INTRAVENOUS
  Filled 2013-10-16 (×3): qty 8

## 2013-10-16 MED ORDER — METOPROLOL TARTRATE 100 MG PO TABS
100.0000 mg | ORAL_TABLET | Freq: Two times a day (BID) | ORAL | Status: DC
  Administered 2013-10-16 – 2013-10-17 (×2): 100 mg via ORAL
  Filled 2013-10-16 (×3): qty 1

## 2013-10-16 MED ORDER — FENTANYL CITRATE 0.05 MG/ML IJ SOLN
INTRAMUSCULAR | Status: AC
Start: 2013-10-16 — End: 2013-10-16
  Filled 2013-10-16: qty 2

## 2013-10-16 MED ORDER — SODIUM CHLORIDE 0.9 % IV SOLN
INTRAVENOUS | Status: DC
  Administered 2013-10-16: 09:00:00 via INTRAVENOUS

## 2013-10-16 MED ORDER — ENALAPRIL MALEATE 10 MG PO TABS
10.0000 mg | ORAL_TABLET | Freq: Two times a day (BID) | ORAL | Status: DC
  Administered 2013-10-16 – 2013-10-17 (×2): 10 mg via ORAL
  Filled 2013-10-16 (×3): qty 1

## 2013-10-16 MED ORDER — NIACIN ER 500 MG PO CPCR
1500.0000 mg | ORAL_CAPSULE | Freq: Three times a day (TID) | ORAL | Status: DC
  Administered 2013-10-16 – 2013-10-17 (×2): 1500 mg via ORAL
  Filled 2013-10-16 (×5): qty 3

## 2013-10-16 MED ORDER — LIDOCAINE HCL (PF) 1 % IJ SOLN
INTRAMUSCULAR | Status: AC
  Filled 2013-10-16: qty 60

## 2013-10-16 MED ORDER — FENTANYL CITRATE 0.05 MG/ML IJ SOLN
INTRAMUSCULAR | Status: AC
  Filled 2013-10-16: qty 2

## 2013-10-16 MED ORDER — ONDANSETRON HCL 4 MG/2ML IJ SOLN: 4.0000 mg | Freq: Four times a day (QID) | INTRAMUSCULAR | Status: DC | PRN

## 2013-10-16 MED ORDER — MUPIROCIN 2 % EX OINT
TOPICAL_OINTMENT | Freq: Two times a day (BID) | CUTANEOUS | Status: DC
  Administered 2013-10-16: 1 via NASAL
  Administered 2013-10-17: 10:00:00 via NASAL

## 2013-10-16 MED ORDER — MIDAZOLAM HCL 5 MG/5ML IJ SOLN
INTRAMUSCULAR | Status: AC
  Filled 2013-10-16: qty 5

## 2013-10-16 NOTE — H&P (View-Only) (Signed)
 ELECTROPHYSIOLOGY OFFICE NOTE  Patient ID: Billy [name] MRN: [id number], DOB/AGE: 07/06/1951   Date of Visit: 10/03/2013  Primary Physician: Teresa Tullo, MD  Primary Cardiologist: Steve Klein, MD Reason for Visit: EP/device follow-up  History of Present Illness  Billy [name] is a 62 y.o. male with an ischemic CM s/p single chamber ICD implant as part of MADIT-CRT protocol in 2008, VT with syncope and ICD shocks in 2011, chronic systolic HF, LBBB, CAD s/p lateral wall MI 1991, HTN and dyslipidemia who presents today for routine electrophysiology followup. He is accompanied by his wife. His ICD battery is at [facility].  Since last being seen in our clinic, he reports he is doing well and has no complaints. He remains quite active. He has chronic DOE with moderate exertion but denies worsening. He can walk approximately 2 blocks without much difficulty. He notices DOE when walking up stairs. He denies chest pain. He denies palpitations, dizziness, near syncope or syncope. He denies LE swelling, orthopnea, PND or recent weight gain. He denies ICD shocks. He is compliant and tolerating medications without difficulty.  Past Medical History Past Medical History  Diagnosis Date  . Ischemic cardiomyopathy   . LBBB (left bundle branch block)   . Obesity   . Hypertension   . Dyslipidemia   . Dual implantable cardiac defibrillator in situ     MADIT-CRT trial  . Ventricular tachycardia     syncopal  treated via ICD  . Chicken pox   . Heart disease   . Elevated blood pressure reading   . Hyperlipidemia   . Kidney stone   . Gout     Past Surgical History Past Surgical History  Procedure Laterality Date  . Insert / replace / remove pacemaker      Implantable cardioverter-defibrillator as part of #1-Guid[employer] Vitality T175  . Cholecystectomy    . Appendectomy  1955  . Heart attack  1991  . Knee surgery  2005  . Implantable cardioverter defibrillator implant       Allergies/Intolerances No Known Allergies  Current Home Medications Current Outpatient Prescriptions  Medication Sig Dispense Refill  . aspirin 81 MG tablet Take 81 mg by mouth daily.        . cholecalciferol (VITAMIN D) 400 UNITS TABS Take by mouth.      . enalapril (VASOTEC) 20 MG tablet Take 1/2 tablet twice a day  90 tablet  3  . furosemide (LASIX) 40 MG tablet Take 1 tablet (40 mg total) by mouth daily.  90 tablet  1  . ibuprofen (ADVIL,MOTRIN) 800 MG tablet Take 1 tablet every 8 hours for inflammation. Take for 10 days. Take with food.  30 tablet  0  . magnesium oxide (MAG-OX) 400 MG tablet Take 400 mg by mouth every other day.      . metoprolol (LOPRESSOR) 100 MG tablet Take 1 tablet (100 mg total) by mouth 2 (two) times daily.  180 tablet  3  . multivitamin (THERAGRAN) per tablet Take 1 tablet by mouth daily.        . niacin 500 MG CR capsule Take 1,500 mg by mouth 3 (three) times daily.        . Omega-3 Fatty Acids (FISH OIL) 1000 MG CAPS Take 1,000 mg by mouth every other day.       . potassium chloride SA (K-DUR,KLOR-CON) 20 MEQ tablet Take 20 mEq by mouth every other day.        . vitamin E 400 UNIT   capsule Take 400 Units by mouth as needed.        No current facility-administered medications for this visit.    Social History Social History  . Marital Status: Married   Social History Main Topics  . Smoking status: Former Smoker -- 1.00 packs/day for 30 years    Types: Cigarettes    Quit date: 06/08/1990  . Smokeless tobacco: No     Comment: Tobacco use-no  . Alcohol Use: 1.5 oz/week    3 drink(s) per week     Comment: Occasional  . Drug Use: No   Review of Systems General: No chills, fever, night sweats or weight changes Cardiovascular: No chest pain, dyspnea on exertion, edema, orthopnea, palpitations, paroxysmal nocturnal dyspnea Dermatological: No rash, lesions or masses Respiratory: No cough, dyspnea Urologic: No hematuria, dysuria Abdominal: No nausea,  vomiting, diarrhea, bright red blood per rectum, melena, or hematemesis Neurologic: No visual changes, weakness, changes in mental status All other systems reviewed and are otherwise negative except as noted above.  Physical Exam Vitals: Blood pressure 100/64, pulse 56, height 5' 8" (1.727 m), weight 275 lb (124.739 kg), SpO2 98.00%.  General: Well developed, well appearing 62 y.o. male in no acute distress. HEENT: Normocephalic, atraumatic. EOMs intact. Sclera nonicteric. Oropharynx clear.  Neck: Supple without bruits. No JVD. Lungs: Respirations regular and unlabored, CTA bilaterally. No wheezes, rales or rhonchi. Heart: RRR. S1, S2 present. No murmurs, rub, S3 or S4. Abdomen: Soft, non-tender, non-distended. BS present x 4 quadrants. No hepatosplenomegaly.  Extremities: No clubbing, cyanosis or edema. DP/PT/Radials 2+ and equal bilaterally. Psych: Normal affect. Neuro: Alert and oriented X 3. Moves all extremities spontaneously. Skin: Left chest / implant site well healed.   Diagnostics 12-lead ECG from August 2014 - SR at 68 bpm, one PVC; IVCD with QRS duration 158 msec Device interrogation - ICD battery at [facility] otherwise stable lead measurements; no episodes / arrhythmias; no programming changes made; see PaceArt report for full details  Assessment and Plan 1. ICD battery at [facility] 2. Ischemic CM s/p ICD implant (enrolled in MADIT-CRT trial) 3. Chronic systolic HF 4. Paroxysmal VT 5. CAD  Mr. Sollenberger's ICD battery is at [facility]. Discussed the need for ICD generator change and upgrade to CRT-D which he tells me Dr. Klein has recommended for him in the past. He is expected to receive benefit from CRT. I will order an echo to evaluate his LV function in addition to 12-lead ECG to assess QRS duration (last ECG showed IVCD, duration 158 msec). Risks, benefits and alternatives to Bi-V ICD implant were discussed in detail with the patient today. These risks include, but are not limited to,  bleeding, infection, pneumothorax, perforation, tamponade, vascular damage, lead dislodgement, renal failure, MI, stroke and death. Mr. Frandsen expressed verbal understanding and agrees to proceed. I will confer with Dr. Klein but have tentatively scheduled his procedure for 10/16/2013.   Signed, Smantha Boakye, PA-C 10/03/2013, 11:32 AM  ADDENDUM - 10/10/2013 9:42 AM I spoke with Dr. Klein who agrees with the plan of care as outlined above. 

## 2013-10-16 NOTE — Progress Notes (Signed)
Orthopedic Tech Progress Note Patient Details:  Billy [name] 1951/08/12 [id number]  Ortho Devices Type of Ortho Device: Arm sling Ortho Device/Splint Location: lue Ortho Device/Splint Interventions: Application   Pina Sirianni 10/16/2013, 1:41 PM

## 2013-10-16 NOTE — Progress Notes (Signed)
Will diuresis vigorously as has 2-3+ edema B and anticiapte discharge in am BMET pending

## 2013-10-16 NOTE — Interval H&P Note (Signed)
ICD Criteria  Current LVEF (within 6 months):20-25%  NYHA Functional Classification: Class II  Heart Failure History:  Yes, Duration of heart failure since onset is > 9 months  Non-Ischemic Dilated Cardiomyopathy History:  No.  Atrial Fibrillation/Atrial Flutter:  No.  Ventricular Tachycardia History:  Yes, No hemodynamic instability. VT Type:  SVT - Monomorphic.  Cardiac Arrest History:  No  History of Syndromes with Risk of Sudden Death:  No.  Previous ICD:  Yes, ICD Type:  Single, Reason for ICD:  Secondary, reason for secondary prevention:  Not Documented.  Electrophysiology Study: Yes, EP Study timeframe: > 6 months, Results of EP study unattainable.  Anticoagulation Therapy:  Patient is NOT on anticoagulation therapy.   Beta Blocker Therapy:  Yes.   Ace Inhibitor/ARB Therapy:  Yes.History and Physical Interval Note:  10/16/2013 10:22 AM  Billy [name]  has presented today for surgery, with the diagnosis of [facility]  The various methods of treatment have been discussed with the patient and family. After consideration of risks, benefits and other options for treatment, the patient has consented to  Procedure(s): BIV ICD GENERTAOR CHANGE OUT (N/A) as a surgical intervention .  The patient's history has been reviewed, patient examined, no change in status, stable for surgery.  I have reviewed the patient's chart and labs.  Questions were answered to the patient's satisfaction.     Sherryl Manges

## 2013-10-16 NOTE — CV Procedure (Signed)
Billy [name] [id number]  [id number]  Preop Dx:icm  Device at [facility]  CHF IVCD Postop Dx same/patent L subclavian vein  Procedure: venogram device explant and CRT device implkant, atrial and LV laead insertion pocket revision  Cx: None   Dictation number [id number]  Sherryl Manges, MD 10/16/2013 12:29 PM

## 2013-10-16 NOTE — Interval H&P Note (Signed)
History and Physical Interval Note:  10/16/2013 10:10 AM  Billy [name]  has presented today for surgery, with the diagnosis of ERI  The various methods of treatment have been discussed with the patient and family. After consideration of risks, benefits and other options for treatment, the patient has consented to  Procedure(s): BIV ICD GENERTAOR CHANGE OUT (N/A) as a surgical intervention .  The patient's history has been reviewed, patient examined, no change in status, stable for surgery.  I have reviewed the patient's chart and labs.  Questions were answered to the patient's satisfaction.     Sherryl Manges Procedure is potentially Biv upgrade.  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

## 2013-10-16 NOTE — Discharge Summary (Signed)
ELECTROPHYSIOLOGY PROCEDURE DISCHARGE SUMMARY    Patient ID: Billy [name],  MRN: [id number], DOB/AGE: [birth year] 62 y.o.  Admit date: 10/16/2013 Discharge date: 10/16/2013  Primary Care Physician: Duncan Dull, MD Electrophysiologist: Sherryl Manges, MD  Primary Discharge Diagnosis:  Ischemic cardiomyopathy, congestive heart failure, LBBB with previously implanted ICD s/p CRTD upgrade this admission  Secondary Discharge Diagnosis:  1.  Ventricular tachycardia with appropriate ICD therapy 2.  CAD - s/p lateral wall MI in 1991 3.  Hypertension 4.  Hyperlipidemia  No Known Allergies   Procedures This Admission:  1.  Upgrade of a previously implanted single chamber ICD to CRTD on 10-16-2013.  See Dr Odessa Fleming op note for full details.  There were no early apparent complications.  2.  CXR on  demonstrated no pneumothorax status post device implantation.   Brief HPI: Billy [name] is a 62 y.o. male with a past medical history of ischemic cardiomyopathy, congestive heart failure and LBBB.  He underwent ICD implantation in 2008 as part of the MADIT-CRT protocol.  His device has recently reached elective replacement indicator.  Because of the results of that study, he is eligible for CRTD upgrade.  Risks, benefits, and alternatives to the procedure were reviewed with the patient who wished to proceed.   Hospital Course:  The patient was admitted and underwent upgrade of his previously implanted single chamber ICD to CRTD with details as outlined above.   He was monitored on telemetry overnight which demonstrated nsr with BiV Pacing.  Left chest was without hematoma or ecchymosis.  The device was interrogated and found to be functioning normally.  CXR was obtained and demonstrated no pneumothorax status post device implantation.  Wound care, arm mobility, and restrictions were reviewed with the patient.  Dr Ladona Ridgel examined the patient and considered them stable for discharge to home.     The patient's discharge medications include an ACE-I (Enalapril) and beta blocker (Metoprolol).   Discharge Vitals: Blood pressure 128/62, pulse 67, temperature 97.6 F (36.4 C), temperature source Oral, resp. rate 18, height 5\' 9"  (1.753 m), weight 275 lb (124.739 kg), SpO2 96.00%.    Exam - no hematoma or ecchymosis over ICD insertion site Labs:   Lab Results  Component Value Date   WBC 5.1 10/15/2013   HGB 11.5* 10/15/2013   HCT 33.1* 10/15/2013   MCV 93 10/15/2013   PLT 127.0* 04/29/2013    Recent Labs Lab 10/15/13 1508  NA 139  K 4.4  CL 103  CO2 25  BUN 23  CREATININE 1.17  CALCIUM 8.5*  GLUCOSE 110*    Discharge Medications:    Medication List    ASK your doctor about these medications       aspirin 81 MG tablet  Take 81 mg by mouth daily.     enalapril 20 MG tablet  Commonly known as:  VASOTEC  Take 10 mg by mouth 2 (two) times daily.     Fish Oil 1000 MG Caps  Take 1,000 mg by mouth every other day.     furosemide 40 MG tablet  Commonly known as:  LASIX  Take 1 tablet (40 mg total) by mouth daily.     magnesium oxide 400 MG tablet  Commonly known as:  MAG-OX  Take 400 mg by mouth every other day.     metoprolol 100 MG tablet  Commonly known as:  LOPRESSOR  Take 1 tablet (100 mg total) by mouth 2 (two) times daily.  multivitamin per tablet  Take 1 tablet by mouth daily.     niacin 500 MG CR capsule  Take 1,500 mg by mouth 3 (three) times daily.     potassium chloride SA 20 MEQ tablet  Commonly known as:  K-DUR,KLOR-CON  Take 20 mEq by mouth every other day.        Disposition:       Future Appointments Provider Department Dept Phone   10/28/2013 12:00 PM [facility] Office [phone]   01/16/2014 11:15 AM Duke Salvia, MD [facility] Office [phone]       Duration of Discharge Encounter: Less than 30 minutes including physician time.  Signed, Leonia Reeves.D.

## 2013-10-17 ENCOUNTER — Ambulatory Visit (HOSPITAL_COMMUNITY): Payer: BC Managed Care – PPO

## 2013-10-17 LAB — BASIC METABOLIC PANEL
CO2: 28 mEq/L (ref 19–32)
Calcium: 8.9 mg/dL (ref 8.4–10.5)
GFR calc non Af Amer: 73 mL/min — ABNORMAL LOW (ref >90)
Potassium: 3.7 mEq/L (ref 3.5–5.1)
Sodium: 138 mEq/L (ref 135–145)

## 2013-10-17 MED ORDER — ENALAPRIL MALEATE 20 MG PO TABS: 10.0000 mg | ORAL_TABLET | Freq: Every day | ORAL | Status: DC

## 2013-10-17 MED ORDER — NITROGLYCERIN 0.4 MG SL SUBL: 0.4000 mg | SUBLINGUAL_TABLET | SUBLINGUAL | Status: AC | PRN

## 2013-10-17 NOTE — Progress Notes (Signed)
Pt had an episode of lightheadedness along with some sweating while down getting his CXR. Pt's HR was noted to be in the 40s on the monitor. BP was 90/56. Pt feels better now. Cardiologist on call [place] notified. New orders to hold am dose of metoprolol. Will continue to monitor the pt. Sanda Linger, RN

## 2013-10-17 NOTE — Progress Notes (Signed)
This encounter was created in error - please disregard.

## 2013-10-17 NOTE — Op Note (Signed)
NAMEJUSTO, HENGEL NO.:  [id number]  MEDICAL RECORD NO.:  [id number]  LOCATION:  [id number]                        FACILITY:  [facility]  PHYSICIAN:  Duke Salvia, MD, FACCDATE OF BIRTH:  [birth year]  DATE OF PROCEDURE:  10/16/2013 DATE OF DISCHARGE:                              OPERATIVE REPORT   PREOPERATIVE DIAGNOSES:  Ischemic cardiomyopathy, congestive heart failure - class II-III, nonspecific IVCD with a QRS duration of greater than 150 milliseconds, previously implanted ICD now at [facility].  POSTOPERATIVE DIAGNOSES:  Ischemic cardiomyopathy, congestive heart failure - class II-III, nonspecific IVCD with a QRS duration of greater than 150 milliseconds, previously implanted ICD now at [facility]; patent left subclavian vein.  PROCEDURE: 1. Left upper extremity venogram. 2. Device explantation. 3. Atrial lead insertion. 4. Left ventricular lead insertion. 5. Pocket revision. 6. New device implantation. 7. Lead assessment.  Following obtaining informed consent, the patient was brought to the Electrophysiology Laboratory and placed on the fluoroscopic table in supine position.  After routine prep and drape of the left upper chest, contrast venogram having been obtained demonstrating patency of the extrathoracic left subclavian vein.  With the help of local anesthesia and conscious sedation, an incision was made and carried down to the layer of the prepectoral fascia with opening of the device pocket.  We then obtained access to the extrathoracic left subclavian vein which was accomplished with moderate difficulty but without the aspiration of air or puncture of the artery.  There is some narrowing at the innominate SVC junction requiring the use of Wholey wire to pass into the SVC.  I then struggled to get a 2nd venipuncture, so we double wired and used 2 Wholey wires, so that we had access past the innominate/SVC junction.  Initially a 9.5 French sheath was placed  through which was then passed a Kohl's 135-degree CX access sheath through which was deployed, a Financial trader quadripolar LV lead serial number [id number].  Under fluoroscopic guidance, it was manipulated to the lateral wall where the tip is in the distal 3rd, but the 3rd and 4th poles were at the junction between the mid and basilar 3rd.  In this location, the bipolar L-wave was 28.2 with a pace impedance of 706, threshold 0.3 at 0.5, and the RV/LV interval was 100 milliseconds.  A 9.5-French sheath was removed and a 7-French sheath was placed through which was passed a Medtronic 5076 52 cm active fixation atrial lead, serial number [id number].  It was deployed to the right atrial appendage where bipolar P-wave was 3.8 with a pace impedance of 675, threshold 0.6 at 0.5.  Current of injury was brisk.  There was no diaphragmatic pacing at 10 V.  This lead was secured to the prepectoral fascia.  The LV deployment system was then removed and the LV lead was secured to the prepectoral fascia.  Extra slack was positioned in a right atrium and then a hemostatic suture was secured.  At this point, the device pocket was opened.  The device was explanted.  Because of a new device had a header that was about 0.5 cm larger than the previous header, we had to extend the pocket about 2-1/2  cm caudally.  This was accomplished.  Hemostasis was obtained, and then the leads were attached to a [employer], model G146 device, serial number [id number].  Through the device, the bipolar R- wave was 5.2 with a pace impedance of 603, a threshold 0.5 at 0.5.  The R-wave was 15.6 with a pace impedance of 457, threshold 0.6 at 0.5, and the LV amplitude was 25 with a pace impedance of 663, threshold 1.1 V at 0.5.  High-voltage impedance was 38 ohms.  The pocket was copiously irrigated with antibiotic containing saline solution.  Hemostasis was assured.  The leads and pulse generator were placed  in the pocket. Surgicel was placed at the base of the pocket along the cephalad ridge. The anchoring suture was noted to be about 2 cm below the incision line and I chose not to put an anchoring suture and the pocket was then closed in 2 layers in normal fashion.  Dermabond dressing was applied.  Needle counts, sponge counts, and instrument counts were correct at the end of the procedure according to the staff.  The patient tolerated the procedure without apparent complications.     Duke Salvia, MD, [facility]     SCK/MEDQ  D:  10/16/2013  T:  10/17/2013  Job:  [id number]

## 2013-10-17 NOTE — Progress Notes (Signed)
DC orders received.  Patient stable with no S/S of distress.  Medication and discharge information reviewed with patient and patient's wife.  Per Alinda Money, A., PA, patient instructed to take BP daily and hold all BP meds for SBP< 100.  Patient DC home with wife. [place], Billy [name]

## 2013-10-19 ENCOUNTER — Encounter: Payer: Self-pay | Admitting: Internal Medicine

## 2013-10-23 ENCOUNTER — Encounter: Payer: Self-pay | Admitting: Internal Medicine

## 2013-10-28 ENCOUNTER — Ambulatory Visit: Payer: BC Managed Care – PPO

## 2013-10-29 ENCOUNTER — Ambulatory Visit (INDEPENDENT_AMBULATORY_CARE_PROVIDER_SITE_OTHER): Payer: BC Managed Care – PPO | Admitting: *Deleted

## 2013-10-29 ENCOUNTER — Encounter: Payer: Self-pay | Admitting: Internal Medicine

## 2013-10-29 DIAGNOSIS — I509 Heart failure, unspecified: Secondary | ICD-10-CM

## 2013-10-29 DIAGNOSIS — I5022 Chronic systolic (congestive) heart failure: Secondary | ICD-10-CM

## 2013-10-29 DIAGNOSIS — I472 Ventricular tachycardia: Secondary | ICD-10-CM

## 2013-10-29 DIAGNOSIS — I2589 Other forms of chronic ischemic heart disease: Secondary | ICD-10-CM

## 2013-10-29 LAB — MDC_IDC_ENUM_SESS_TYPE_INCLINIC
Brady Statistic RA Percent Paced: 1 %
Brady Statistic RV Percent Paced: 97 %
Date Time Interrogation Session: 20141209050000
HighPow Impedance: 43 Ohm
Implantable Pulse Generator Serial Number: 101974
Lead Channel Impedance Value: 451 Ohm
Lead Channel Impedance Value: 654 Ohm
Lead Channel Pacing Threshold Amplitude: 0.3 V
Lead Channel Pacing Threshold Amplitude: 1.5 V
Lead Channel Pacing Threshold Pulse Width: 0.5 ms
Lead Channel Pacing Threshold Pulse Width: 0.5 ms
Lead Channel Sensing Intrinsic Amplitude: 17.3 mV
Lead Channel Sensing Intrinsic Amplitude: 25 mV
Lead Channel Sensing Intrinsic Amplitude: 4.1 mV
Lead Channel Setting Pacing Amplitude: 2.5 V
Lead Channel Setting Pacing Amplitude: 2.5 V
Lead Channel Setting Pacing Pulse Width: 0.5 ms

## 2013-10-29 NOTE — Progress Notes (Signed)
Wound check-ICD in office. 

## 2013-11-02 ENCOUNTER — Encounter: Payer: Self-pay | Admitting: Internal Medicine

## 2013-11-04 ENCOUNTER — Encounter: Payer: Self-pay | Admitting: Internal Medicine

## 2013-11-05 ENCOUNTER — Other Ambulatory Visit: Payer: Self-pay | Admitting: Adult Health

## 2014-01-13 ENCOUNTER — Telehealth: Payer: Self-pay | Admitting: Internal Medicine

## 2014-01-13 DIAGNOSIS — D7389 Other diseases of spleen: Secondary | ICD-10-CM

## 2014-01-13 DIAGNOSIS — D739 Disease of spleen, unspecified: Principal | ICD-10-CM

## 2014-01-13 NOTE — Telephone Encounter (Signed)
Patient needs follow up CT per Dr Sherrlyn HockPandit to evlauate splenic lesion followed by an office visit with me

## 2014-01-15 NOTE — Telephone Encounter (Signed)
Done

## 2014-01-16 ENCOUNTER — Encounter: Payer: BC Managed Care – PPO | Admitting: Internal Medicine

## 2014-01-21 ENCOUNTER — Ambulatory Visit (INDEPENDENT_AMBULATORY_CARE_PROVIDER_SITE_OTHER): Payer: BC Managed Care – PPO | Admitting: Internal Medicine

## 2014-01-21 ENCOUNTER — Encounter: Payer: Self-pay | Admitting: Internal Medicine

## 2014-01-21 VITALS — BP 108/80 | HR 63 | Ht 69.5 in | Wt 271.8 lb

## 2014-01-21 DIAGNOSIS — I4729 Other ventricular tachycardia: Secondary | ICD-10-CM

## 2014-01-21 DIAGNOSIS — I10 Essential (primary) hypertension: Secondary | ICD-10-CM

## 2014-01-21 DIAGNOSIS — I472 Ventricular tachycardia: Secondary | ICD-10-CM

## 2014-01-21 DIAGNOSIS — I509 Heart failure, unspecified: Secondary | ICD-10-CM

## 2014-01-21 DIAGNOSIS — Z95810 Presence of automatic (implantable) cardiac defibrillator: Secondary | ICD-10-CM

## 2014-01-21 DIAGNOSIS — I5022 Chronic systolic (congestive) heart failure: Secondary | ICD-10-CM

## 2014-01-21 LAB — MDC_IDC_ENUM_SESS_TYPE_INCLINIC
HighPow Impedance: 43 Ohm
Lead Channel Impedance Value: 481 Ohm
Lead Channel Pacing Threshold Amplitude: 0.3 V
Lead Channel Pacing Threshold Pulse Width: 0.5 ms
Lead Channel Sensing Intrinsic Amplitude: 16.8 mV
Lead Channel Sensing Intrinsic Amplitude: 25 mV
Lead Channel Sensing Intrinsic Amplitude: 6.1 mV
Lead Channel Setting Pacing Amplitude: 2.3 V
Lead Channel Setting Pacing Pulse Width: 0.5 ms
Lead Channel Setting Sensing Sensitivity: 1 mV
MDC IDC MSMT LEADCHNL LV IMPEDANCE VALUE: 471 Ohm
MDC IDC MSMT LEADCHNL LV PACING THRESHOLD AMPLITUDE: 1.2 V
MDC IDC MSMT LEADCHNL LV PACING THRESHOLD PULSEWIDTH: 0.5 ms
MDC IDC MSMT LEADCHNL RA IMPEDANCE VALUE: 669 Ohm
MDC IDC MSMT LEADCHNL RV PACING THRESHOLD AMPLITUDE: 0.5 V
MDC IDC MSMT LEADCHNL RV PACING THRESHOLD PULSEWIDTH: 0.5 ms
MDC IDC PG SERIAL: 101974
MDC IDC SESS DTM: 20150303050000
MDC IDC SET LEADCHNL LV PACING PULSEWIDTH: 0.5 ms
MDC IDC SET LEADCHNL RA PACING AMPLITUDE: 2 V
MDC IDC SET LEADCHNL RV PACING AMPLITUDE: 2.4 V
MDC IDC SET LEADCHNL RV SENSING SENSITIVITY: 0.6 mV
MDC IDC STAT BRADY RA PERCENT PACED: 1 %
MDC IDC STAT BRADY RV PERCENT PACED: 96 %
Zone Setting Detection Interval: 286 ms
Zone Setting Detection Interval: 333 ms

## 2014-01-21 MED ORDER — CARVEDILOL 25 MG PO TABS: 25.0000 mg | ORAL_TABLET | Freq: Two times a day (BID) | ORAL | Status: DC

## 2014-01-21 MED ORDER — ROSUVASTATIN CALCIUM 10 MG PO TABS: ORAL_TABLET | ORAL | Status: DC

## 2014-01-21 NOTE — Patient Instructions (Addendum)
Remote monitoring is used to monitor your Pacemaker of ICD from home. This monitoring reduces the number of office visits required to check your device to one time per year. It allows us to keep an eye on the functioning of your device to ensure it is working properly. You are scheduled for a device check from home in December. You may send your transmission at any time that day. If you have a wireless device, the transmission will be sent automatically. After your physician reviews your transmission, you will receive a postcard with your next transmission date.  Your physician has recommended you make the following change in your medication:  Stop Metoprolol  Stop Niacin Start Coreg 25 mg twice daily  Start Crestor 10 mg on Mon Wed Fri   Your physician wants you to follow-up in: 6 months. You will receive a reminder letter in the mail two months in advance. If you don't receive a letter, please call our office to schedule the follow-up appointment.

## 2014-01-21 NOTE — Progress Notes (Signed)
      Patient Care Team: Sherlene Shamseresa L Tullo, MD as PCP - General (Internal Medicine)   HPI  Billy [name] is a 63 y.o. male Seen in followup for recent CRT upgrade in the context of ischemic cardiomyopathy  His dyspnea  Is improved    Past Medical History  Diagnosis Date  . Ischemic cardiomyopathy     s/p ICD implant 2008 with subsequent upgrade to CRTD 2014  . LBBB (left bundle branch block)   . Obesity   . Hypertension   . Ventricular tachycardia     syncopal  treated via ICD  . Chicken pox   . Heart disease   . Hyperlipidemia   . Kidney stone   . Gout   . Automatic implantable cardioverter-defibrillator in situ   . Myocardial infarction 1991  . Arthritis     Past Surgical History  Procedure Laterality Date  . Implantable cardioverter defibrillator implant  2008    as part of MADIT-CRT trial - single chamber device  . Cholecystectomy    . Appendectomy  1955  . Knee surgery  2005  . Bi-ventricular implantable cardioverter defibrillator upgrade  10-16-2013    CRTD upgrade by Dr Graciela HusbandsKlein  . Icd generator change  10/16/2013    Dr Graciela HusbandsKlein    Current Outpatient Prescriptions  Medication Sig Dispense Refill  . aspirin 81 MG tablet Take 81 mg by mouth daily.        . enalapril (VASOTEC) 20 MG tablet TAKE ONE-HALF TABLET BY MOUTH TWICE DAILY  90 tablet  1  . furosemide (LASIX) 40 MG tablet Take 1 tablet (40 mg total) by mouth daily.  90 tablet  1  . magnesium oxide (MAG-OX) 400 MG tablet Take 400 mg by mouth every other day.      . metoprolol (LOPRESSOR) 100 MG tablet TAKE ONE TABLET BY MOUTH TWICE DAILY  180 tablet  1  . multivitamin (THERAGRAN) per tablet Take 1 tablet by mouth daily.        . niacin 500 MG CR capsule Take 1,500 mg by mouth 3 (three) times daily.       . nitroGLYCERIN (NITROSTAT) 0.4 MG SL tablet Place 1 tablet (0.4 mg total) under the tongue every 5 (five) minutes as needed for chest pain.  25 tablet  3  . Omega-3 Fatty Acids (FISH OIL) 1000 MG CAPS  Take 1,000 mg by mouth every other day.       . potassium chloride SA (K-DUR,KLOR-CON) 20 MEQ tablet Take 20 mEq by mouth daily.        No current facility-administered medications for this visit.    No Known Allergies  Review of Systems negative except from HPI and PMH  Physical Exam BP 108/80  Pulse 63  Ht 5' 9.5" (1.765 m)  Wt 271 lb 12 oz (123.265 kg)  BMI 39.57 kg/m2 Well developed and nourished in no acute distress HENT normal Neck supple with JVP-flat Device pocket well healed; without hematoma or erythema.  There is no tethering    Clear Regular rate and rhythm, no murmurs or gallops Abd-soft with active BS No Clubbing cyanosis edema Skin-warm and dry A & Oriented  Grossly normal sensory and motor function    Assessment and  Plan

## 2014-01-27 NOTE — Assessment & Plan Note (Signed)
No intercurrent Ventricular tachycardia  

## 2014-01-27 NOTE — Assessment & Plan Note (Signed)
The patient's device was interrogated and the information was fully reviewed.  The device was reprogrammed to  [facility]

## 2014-01-27 NOTE — Assessment & Plan Note (Signed)
euvolemic 

## 2014-01-28 ENCOUNTER — Telehealth: Payer: Self-pay

## 2014-01-28 NOTE — Telephone Encounter (Signed)
Patient's medication crestor has been denied. The patient was told to stop niacin and start crestor at Dr. Odessa FlemingKlein's last office visit. The patient had tried Atorvastatin in the past but caused muscle aches, sick on stomach. What do your recommend the patient take now?

## 2014-01-28 NOTE — Telephone Encounter (Signed)
Pt is still taking Niacin because he has been unable to get Crestor. I explained that I would have our medication team look into this and get back with him. Pt agreeable to plan.

## 2014-02-05 ENCOUNTER — Telehealth: Payer: Self-pay | Admitting: *Deleted

## 2014-02-05 NOTE — Telephone Encounter (Signed)
Left message for patient to call back regarding medication change per Dr. Graciela HusbandsKlein 3/18

## 2014-02-05 NOTE — Telephone Encounter (Signed)
Late Entry: Given to Kim in medication refills 02/04/14

## 2014-02-06 MED ORDER — ATORVASTATIN CALCIUM 10 MG PO TABS: 10.0000 mg | ORAL_TABLET | Freq: Every day | ORAL | Status: DC

## 2014-02-06 NOTE — Telephone Encounter (Signed)
Informed patient that per Dr. Graciela HusbandsKlein stop Crestor and start Lipitor 10 mg daily. Patient verbalized understanding. Insurance paperwork faxed to [employer].

## 2014-02-19 ENCOUNTER — Encounter: Payer: BC Managed Care – PPO | Admitting: Internal Medicine

## 2014-02-26 ENCOUNTER — Encounter: Payer: Self-pay | Admitting: Internal Medicine

## 2014-02-26 ENCOUNTER — Ambulatory Visit (INDEPENDENT_AMBULATORY_CARE_PROVIDER_SITE_OTHER): Payer: BC Managed Care – PPO | Admitting: Internal Medicine

## 2014-02-26 VITALS — BP 118/70 | HR 64 | Temp 98.2°F | Resp 16 | Ht 68.5 in | Wt 268.2 lb

## 2014-02-26 DIAGNOSIS — Z125 Encounter for screening for malignant neoplasm of prostate: Secondary | ICD-10-CM

## 2014-02-26 DIAGNOSIS — Z1211 Encounter for screening for malignant neoplasm of colon: Secondary | ICD-10-CM

## 2014-02-26 DIAGNOSIS — Z79899 Other long term (current) drug therapy: Secondary | ICD-10-CM

## 2014-02-26 DIAGNOSIS — E785 Hyperlipidemia, unspecified: Secondary | ICD-10-CM

## 2014-02-26 DIAGNOSIS — D696 Thrombocytopenia, unspecified: Secondary | ICD-10-CM

## 2014-02-26 DIAGNOSIS — R5381 Other malaise: Secondary | ICD-10-CM

## 2014-02-26 DIAGNOSIS — D7389 Other diseases of spleen: Secondary | ICD-10-CM

## 2014-02-26 DIAGNOSIS — D739 Disease of spleen, unspecified: Secondary | ICD-10-CM

## 2014-02-26 DIAGNOSIS — Z0000 Encounter for general adult medical examination without abnormal findings: Secondary | ICD-10-CM

## 2014-02-26 DIAGNOSIS — R5383 Other fatigue: Secondary | ICD-10-CM

## 2014-02-26 DIAGNOSIS — E669 Obesity, unspecified: Secondary | ICD-10-CM

## 2014-02-26 NOTE — Progress Notes (Signed)
Patient ID: Billy [name], male   DOB: [birth year], 63 y.o.   MRN: [id number]  The patient is here for his annual male physical examination and management of other chronic and acute problems.   The risk factors are reflected in the social history.  The roster of all physicians providing medical care to patient - is listed in the Snapshot section of the chart.  Activities of daily living:  The patient is 100% independent in all ADLs: dressing, toileting, feeding as well as independent mobility  Home safety : The patient has smoke detectors in the home. He wears seatbelts.  There are no firearms at home. There is no violence in the home.   There is no risks for hepatitis, STDs or HIV. There is no   history of blood transfusion. There is no travel history to infectious disease endemic areas of the world.  The patient has seen their dentist in the last six month and  their eye doctor in the last year.  They do not  have excessive sun exposure. They have seen a dermatoloigist in the last year. Discussed the need for sun protection: hats, long sleeves and use of sunscreen if there is significant sun exposure.   Diet: the importance of a healthy diet is discussed. They do have a healthy diet.  The benefits of regular aerobic exercise were discussed. He exercises a minimum of 30 minutes  5 days per week. Depression screen: there are no signs or vegative symptoms of depression- irritability, change in appetite, anhedonia, sadness/tearfullness.  The following portions of the patient's history were reviewed and updated as appropriate: allergies, current medications, past family history, past medical history,  past surgical history, past social history  and problem list.  Visual acuity was not assessed per patient preference since he has regular follow up with his ophthalmologist. Hearing and body mass index were assessed and reviewed.   During the course of the visit the patient was educated and  counseled about appropriate screening and preventive services including :  nutrition counseling, colorectal cancer screening, and recommended immunizations.    Objective:  BP 118/70  Pulse 64  Temp(Src) 98.2 F (36.8 C) (Oral)  Resp 16  Ht 5' 8.5" (1.74 m)  Wt 268 lb 4 oz (121.677 kg)  BMI 40.19 kg/m2  SpO2 97%  General Appearance:    Alert, obese, cooperative, no distress, appears stated age  Head:    Normocephalic, without obvious abnormality, atraumatic  Eyes:    PERRL, conjunctiva/corneas clear, EOM's intact, fundi    benign, both eyes       Ears:    Normal TM's and external ear canals, both ears  Nose:   Nares normal, septum midline, mucosa normal, no drainage   or sinus tenderness  Throat:   Lips, mucosa, and tongue normal; teeth and gums normal  Neck:   Supple, symmetrical, trachea midline, no adenopathy;       thyroid:  No enlargement/tenderness/nodules; no carotid   bruit or JVD  Back:     Symmetric, no curvature, ROM normal, no CVA tenderness  Lungs:     Clear to auscultation bilaterally, respirations unlabored  Chest wall:    No tenderness or deformity  Heart:    Regular rate and rhythm, S1 and S2 normal, no murmur, rub   or gallop  Abdomen:     Soft, non-tender, bowel sounds active all four quadrants,    no masses, no organomegaly  Genitalia:    Normal male without lesion,  discharge or tenderness  Rectal:    Normal tone, normal prostate, no masses or tenderness;   guaiac negative stool  Extremities:   Extremities normal, atraumatic, no cyanosis or edema  Pulses:   2+ and symmetric all extremities  Skin:   Skin color, texture, turgor normal, no rashes or lesions  Lymph nodes:   Cervical, supraclavicular, and axillary nodes normal  Neurologic:   CNII-XII intact. 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  He had a comprehensive workup including  ultrasound of his spleen. He says that he stillpaying the medical bills associated with the  workup. Records requested. He has not had any bleeding events. He has not been placed on steroids.    Obesity (BMI 30-39.9) I have addressed  BMI and recommended wt loss of 10% of body weight  26 lbs  over the next 6 months using a low glycemic index diet and regular exercise a minimum of 5 days per week.    Visit for preventive health examination Annual male exam was done including testicular and prostate exam. PSA is pending .  Colon ca screening was reviewed and options given.    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  He had a comprehensive workup including ultrasound of his spleen. He says that he stillpaying the medical bills associated with the  workup. Records requested. He has not had any bleeding events. He has not been placed on steroids.     Updated Medication List Outpatient Encounter Prescriptions as of 02/26/2014  Medication Sig  . aspirin 81 MG tablet Take 81 mg by mouth daily.    Marland Kitchen atorvastatin (LIPITOR) 10 MG tablet Take 1 tablet (10 mg total) by mouth daily.  . carvedilol (COREG) 25 MG tablet Take 1 tablet (25 mg total) by mouth 2 (two) times daily.  . enalapril (VASOTEC) 20 MG tablet TAKE ONE-HALF TABLET BY MOUTH TWICE DAILY  . furosemide (LASIX) 40 MG tablet Take 1 tablet (40 mg total) by mouth daily.  . magnesium oxide (MAG-OX) 400 MG tablet Take 400 mg by mouth every other day.  . multivitamin (THERAGRAN) per tablet Take 1 tablet by mouth daily.    . nitroGLYCERIN (NITROSTAT) 0.4 MG SL tablet Place 1 tablet (0.4 mg total) under the  tongue every 5 (five) minutes as needed for chest pain.  . Omega-3 Fatty Acids (FISH OIL) 1000 MG CAPS Take 1,000 mg by mouth every other day.   . potassium chloride SA (K-DUR,KLOR-CON) 20 MEQ tablet Take 20 mEq by mouth daily.

## 2014-02-26 NOTE — Assessment & Plan Note (Signed)
I have addressed  BMI and recommended wt loss of 10% of body weight  26 lbs  over the next 6 months using a low glycemic index diet and regular exercise a minimum of 5 days per week.

## 2014-02-26 NOTE — Patient Instructions (Signed)
You had your annual  wellness exam today  The stool testis your annual screen for colon cancer.  We will contact you with the bloodwork results  I want you totry to  lose 26 lbs (10%) over the next 6  To 12 months  This is  One version of a  "Low GI"  Diet:  It will still lower your blood sugars and allow you to lose 4 to 8  lbs  per month if you follow it carefully.  Your goal with exercise is a minimum of 30 minutes of aerobic exercise 5 days per week (Walking does not count once it becomes easy!)    All of the foods can be found at grocery stores and in bulk at [employer].  The Atkins protein bars and shakes are available in more varieties at Target, WalMart and [employer]'s [employer].     7 AM Breakfast:  Choose from the following:  Low carbohydrate Protein  Shakes (I recommend the EAS AdvantEdge "Carb Control" shakes  Or the low carb shakes by Atkins.    2.5 carbs   Arnold's "Sandwhich Thin"toasted  w/ peanut butter (no jelly: about 20 net carbs  "Bagel Thin" with cream cheese and salmon: about 20 carbs   a scrambled egg/bacon/cheese burrito made with Mission's "carb balance" whole wheat tortilla  (about 10 net carbs )   Avoid cereal and bananas, oatmeal and cream of wheat and grits. They are loaded with carbohydrates!   10 AM: high protein snack  Protein bar by Atkins (the snack size, under 200 cal, usually < 6 net carbs).    A stick of cheese:  Around 1 carb,  100 cal     Dannon Light n Fit [place]Greek Yogurt  (80 cal, 8 carbs)  Other so called "protein bars" and Greek yogurts tend to be loaded with carbohydrates.  Remember, in food advertising, the word "energy" is synonymous for " carbohydrate."  Lunch:   A Sandwich using the bread choices listed, Can use any  Eggs,  lunchmeat, grilled meat or canned tuna), avocado, regular mayo/mustard  and cheese.  A Salad using blue cheese, ranch,  Goddess or vinagrette,  No croutons or "confetti" and no "candied nuts" but regular nuts OK.   No pretzels or  chips.  Pickles and miniature sweet peppers are a good low carb alternative that provide a "crunch"  The bread is the only source of carbohydrate in a sandwich and  can be decreased by trying some of these alternatives to traditional loaf bread  Joseph's makes a pita bread and a flat bread that are 50 cal and 4 net carbs available at BJs and WalMart.  This can be toasted to use with hummous as well  Toufayan makes a low carb flatbread that's 100 cal and 9 net carbs available at [employer] and [employer]  Mission makes 2 sizes of  Low carb whole wheat tortilla  (The large one is 210 cal and 6 net carbs) Avoid "Low fat dressings, as well as Reyne Dum[place] and [address] [place] [place] dressings They are loaded with sugar!   3 PM/ Mid day  Snack:  Consider  1 ounce of  almonds, walnuts, pistachios, pecans, peanuts,  Macadamia nuts or a nut medley.  Avoid "granola"; the dried cranberries and raisins are loaded with carbohydrates. Mixed nuts as long as there are no raisins,  cranberries or dried fruit.     6 PM  Dinner:     Meat/fowl/fish with a green salad, and  either broccoli, cauliflower, green beans, spinach, brussel sprouts or  Lima beans. DO NOT BREAD THE PROTEIN!!      There is a low carb pasta by Dreamfield's that is acceptable and tastes great: only 5 digestible carbs/serving.( All grocery stores but BJs carry it )  Try Kai Levins Angelo's chicken piccata or chicken or eggplant parm over low carb pasta.(Lowes and BJs)   Clifton Custard Sanchez's "Carnitas" (pulled pork, no sauce,  0 carbs) or his beef pot roast to make a dinner burrito (at BJ's)  Pesto over low carb pasta (bj's sells a good quality pesto in the center refrigerated section of the deli   Whole wheat pasta is still full of digestible carbs and  Not as low in glycemic index as Dreamfield's.   Brown rice is still rice,  So skip the rice and noodles if you eat [place] or [place] (or at least limit to 1/2 cup)  9 PM snack :   Breyer's "low carb" fudgsicle or  ice  cream bar (Carb Smart line), or  Weight Watcher's ice cream bar , or another "no sugar added" ice cream;  a serving of fresh berries/cherries with whipped cream   Cheese or DANNON'S LlGHT N FIT [place] YOGURT  Avoid bananas, pineapple, grapes  and watermelon on a regular basis because they are high in sugar.  THINK OF THEM AS DESSERT  Remember that snack Substitutions should be less than 10 NET carbs per serving and meals < 20 carbs. Remember to subtract fiber grams to get the "net carbs."

## 2014-02-26 NOTE — Assessment & Plan Note (Signed)
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  He had a comprehensive workup including ultrasound of his spleen. He says that he stillpaying the medical bills associated with the  workup. Records requested. He has not had any bleeding events. He has not been placed on steroids.   

## 2014-02-26 NOTE — Progress Notes (Signed)
Pre-visit discussion using our clinic review tool. No additional management support is needed unless otherwise documented below in the visit note.  

## 2014-02-27 DIAGNOSIS — Z0000 Encounter for general adult medical examination without abnormal findings: Secondary | ICD-10-CM | POA: Insufficient documentation

## 2014-02-27 LAB — COMPREHENSIVE METABOLIC PANEL
ALT: 25 U/L (ref 0–53)
AST: 24 U/L (ref 0–37)
Albumin: 3.8 g/dL (ref 3.5–5.2)
Alkaline Phosphatase: 73 U/L (ref 39–117)
BILIRUBIN TOTAL: 0.7 mg/dL (ref 0.3–1.2)
BUN: 19 mg/dL (ref 6–23)
CO2: 28 mEq/L (ref 19–32)
CREATININE: 1.1 mg/dL (ref 0.4–1.5)
Calcium: 9.4 mg/dL (ref 8.4–10.5)
Chloride: 99 mEq/L (ref 96–112)
GFR: 71.12 mL/min (ref >60.00)
GLUCOSE: 83 mg/dL (ref 70–99)
POTASSIUM: 4.8 meq/L (ref 3.5–5.1)
Sodium: 135 mEq/L (ref 135–145)
Total Protein: 7 g/dL (ref 6.0–8.3)

## 2014-02-27 LAB — CBC WITH DIFFERENTIAL/PLATELET
BASOS PCT: 1 % (ref 0.0–3.0)
Basophils Absolute: 0.1 10*3/uL (ref 0.0–0.1)
EOS PCT: 1.9 % (ref 0.0–5.0)
Eosinophils Absolute: 0.1 10*3/uL (ref 0.0–0.7)
HEMATOCRIT: 35.6 % — AB (ref 39.0–52.0)
HEMOGLOBIN: 11.9 g/dL — AB (ref 13.0–17.0)
LYMPHS ABS: 1.8 10*3/uL (ref 0.7–4.0)
Lymphocytes Relative: 28.7 % (ref 12.0–46.0)
MCHC: 33.5 g/dL (ref 30.0–36.0)
MCV: 96.9 fl (ref 78.0–100.0)
MONO ABS: 0.7 10*3/uL (ref 0.1–1.0)
Monocytes Relative: 10.8 % (ref 3.0–12.0)
NEUTROS ABS: 3.6 10*3/uL (ref 1.4–7.7)
Neutrophils Relative %: 57.6 % (ref 43.0–77.0)
Platelets: 205 10*3/uL (ref 150.0–400.0)
RBC: 3.67 Mil/uL — AB (ref 4.22–5.81)
RDW: 13.3 % (ref 11.5–14.6)
WBC: 6.3 10*3/uL (ref 4.5–10.5)

## 2014-02-27 LAB — TSH: TSH: 1.66 u[IU]/mL (ref 0.35–5.50)

## 2014-02-27 LAB — LIPID PANEL
Cholesterol: 156 mg/dL (ref 0–200)
HDL: 63 mg/dL (ref >39.00)
LDL CALC: 80 mg/dL (ref 0–99)
Total CHOL/HDL Ratio: 2
Triglycerides: 67 mg/dL (ref 0.0–149.0)
VLDL: 13.4 mg/dL (ref 0.0–40.0)

## 2014-02-27 LAB — PSA: PSA: 0.83 ng/mL (ref 0.10–4.00)

## 2014-02-27 NOTE — Assessment & Plan Note (Signed)
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  He had a comprehensive workup including ultrasound of his spleen. He says that he stillpaying the medical bills associated with the  workup. Records requested. He has not had any bleeding events. He has not been placed on steroids.

## 2014-02-27 NOTE — Assessment & Plan Note (Signed)
Annual male exam was done including testicular and prostate exam. PSA is pending .  Colon ca screening was reviewed and options given.   

## 2014-02-27 NOTE — Assessment & Plan Note (Signed)
Today's lipids are the result of niacin and generic lipitor,  Not crestor bc insurance would not cover  Lab Results  Component Value Date   CHOL 156 02/26/2014   HDL 63.00 02/26/2014   LDLCALC 80 02/26/2014   TRIG 67.0 02/26/2014   CHOLHDL 2 02/26/2014

## 2014-02-28 ENCOUNTER — Encounter: Payer: Self-pay | Admitting: Internal Medicine

## 2014-03-10 ENCOUNTER — Ambulatory Visit (INDEPENDENT_AMBULATORY_CARE_PROVIDER_SITE_OTHER): Payer: BC Managed Care – PPO | Admitting: *Deleted

## 2014-03-10 DIAGNOSIS — Z1211 Encounter for screening for malignant neoplasm of colon: Secondary | ICD-10-CM

## 2014-03-10 LAB — FECAL OCCULT BLOOD, IMMUNOCHEMICAL: FECAL OCCULT BLD: POSITIVE — AB

## 2014-03-11 ENCOUNTER — Encounter: Payer: Self-pay | Admitting: Internal Medicine

## 2014-03-11 DIAGNOSIS — R195 Other fecal abnormalities: Secondary | ICD-10-CM

## 2014-03-11 LAB — FECAL OCCULT BLOOD, GUAIAC: FECAL OCCULT BLD: POSITIVE

## 2014-03-13 ENCOUNTER — Encounter: Payer: Self-pay | Admitting: Internal Medicine

## 2014-03-20 ENCOUNTER — Other Ambulatory Visit: Payer: Self-pay | Admitting: *Deleted

## 2014-03-20 MED ORDER — FUROSEMIDE 40 MG PO TABS: 40.0000 mg | ORAL_TABLET | Freq: Every day | ORAL | Status: DC

## 2014-04-23 ENCOUNTER — Ambulatory Visit: Payer: Self-pay | Admitting: Gastroenterology

## 2014-04-24 ENCOUNTER — Encounter: Payer: Self-pay | Admitting: Internal Medicine

## 2014-04-24 ENCOUNTER — Ambulatory Visit (INDEPENDENT_AMBULATORY_CARE_PROVIDER_SITE_OTHER): Payer: BC Managed Care – PPO | Admitting: *Deleted

## 2014-04-24 DIAGNOSIS — I2589 Other forms of chronic ischemic heart disease: Secondary | ICD-10-CM

## 2014-04-25 NOTE — Progress Notes (Signed)
Remote ICD transmission.   

## 2014-04-28 LAB — MDC_IDC_ENUM_SESS_TYPE_REMOTE
Brady Statistic RA Percent Paced: 0 %
Brady Statistic RV Percent Paced: 97 %
HighPow Impedance: 42 Ohm
Implantable Pulse Generator Serial Number: 101974
Lead Channel Impedance Value: 471 Ohm
Lead Channel Impedance Value: 551 Ohm
Lead Channel Impedance Value: 636 Ohm
Lead Channel Sensing Intrinsic Amplitude: 3.6 mV
Lead Channel Setting Pacing Amplitude: 2 V
Lead Channel Setting Pacing Amplitude: 2.3 V
Lead Channel Setting Pacing Amplitude: 2.4 V
Lead Channel Setting Pacing Pulse Width: 0.5 ms
Lead Channel Setting Pacing Pulse Width: 0.5 ms
Lead Channel Setting Sensing Sensitivity: 0.6 mV
Lead Channel Setting Sensing Sensitivity: 1 mV
Zone Setting Detection Interval: 286 ms
Zone Setting Detection Interval: 333 ms

## 2014-05-03 LAB — HM COLONOSCOPY: HM COLON: NORMAL

## 2014-05-20 ENCOUNTER — Encounter: Payer: Self-pay | Admitting: Cardiology

## 2014-05-29 ENCOUNTER — Other Ambulatory Visit: Payer: Self-pay | Admitting: Internal Medicine

## 2014-07-29 ENCOUNTER — Ambulatory Visit (INDEPENDENT_AMBULATORY_CARE_PROVIDER_SITE_OTHER): Payer: BC Managed Care – PPO | Admitting: *Deleted

## 2014-07-29 DIAGNOSIS — I2589 Other forms of chronic ischemic heart disease: Secondary | ICD-10-CM

## 2014-07-29 NOTE — Progress Notes (Signed)
Remote ICD transmission.   

## 2014-07-31 LAB — MDC_IDC_ENUM_SESS_TYPE_REMOTE
Battery Remaining Longevity: 86 mo
Brady Statistic RA Percent Paced: 0 %
Brady Statistic RV Percent Paced: 97 %
Implantable Pulse Generator Serial Number: 101974
Lead Channel Setting Pacing Amplitude: 2.3 V
Lead Channel Setting Pacing Pulse Width: 0.5 ms
Lead Channel Setting Sensing Sensitivity: 0.6 mV
Lead Channel Setting Sensing Sensitivity: 1 mV
MDC IDC MSMT LEADCHNL RA IMPEDANCE VALUE: 659 Ohm
MDC IDC MSMT LEADCHNL RA SENSING INTR AMPL: 4.3 mV
MDC IDC MSMT LEADCHNL RV IMPEDANCE VALUE: 463 Ohm
MDC IDC SET LEADCHNL LV PACING PULSEWIDTH: 0.5 ms
MDC IDC SET LEADCHNL RA PACING AMPLITUDE: 2 V
MDC IDC SET LEADCHNL RV PACING AMPLITUDE: 2.4 V
Zone Setting Detection Interval: 286 ms
Zone Setting Detection Interval: 333 ms

## 2014-08-04 ENCOUNTER — Other Ambulatory Visit: Payer: Self-pay | Admitting: *Deleted

## 2014-08-04 ENCOUNTER — Other Ambulatory Visit: Payer: Self-pay | Admitting: Internal Medicine

## 2014-08-04 MED ORDER — ENALAPRIL MALEATE 20 MG PO TABS: ORAL_TABLET | ORAL | Status: DC

## 2014-08-04 MED ORDER — FUROSEMIDE 40 MG PO TABS: 40.0000 mg | ORAL_TABLET | Freq: Every day | ORAL | Status: DC

## 2014-08-25 ENCOUNTER — Encounter: Payer: Self-pay | Admitting: Cardiology

## 2014-08-28 ENCOUNTER — Encounter: Payer: Self-pay | Admitting: Internal Medicine

## 2014-10-30 ENCOUNTER — Encounter (HOSPITAL_COMMUNITY): Payer: Self-pay | Admitting: Internal Medicine

## 2014-11-05 ENCOUNTER — Other Ambulatory Visit: Payer: Self-pay | Admitting: Internal Medicine

## 2014-11-08 LAB — BASIC METABOLIC PANEL
ANION GAP: 9 (ref 7–16)
BUN: 18 mg/dL (ref 7–18)
Calcium, Total: 8.3 mg/dL — ABNORMAL LOW (ref 8.5–10.1)
Chloride: 103 mmol/L (ref 98–107)
Co2: 26 mmol/L (ref 21–32)
Creatinine: 1.49 mg/dL — ABNORMAL HIGH (ref 0.60–1.30)
EGFR (African American): >60
GFR CALC NON AF AMER: 51 — AB
Glucose: 116 mg/dL — ABNORMAL HIGH (ref 65–99)
Osmolality: 279 (ref 275–301)
Potassium: 4.1 mmol/L (ref 3.5–5.1)
Sodium: 138 mmol/L (ref 136–145)

## 2014-11-08 LAB — CBC
HCT: 40.5 % (ref 40.0–52.0)
HGB: 13.1 g/dL (ref 13.0–18.0)
MCH: 29.6 pg (ref 26.0–34.0)
MCHC: 32.3 g/dL (ref 32.0–36.0)
MCV: 92 fL (ref 80–100)
PLATELETS: 210 10*3/uL (ref 150–440)
RBC: 4.42 10*6/uL (ref 4.40–5.90)
RDW: 13.3 % (ref 11.5–14.5)
WBC: 7.1 10*3/uL (ref 3.8–10.6)

## 2014-11-08 LAB — TROPONIN I: Troponin-I: <0.02 ng/mL

## 2014-11-08 LAB — TSH: Thyroid Stimulating Horm: 4.63 u[IU]/mL — ABNORMAL HIGH

## 2014-11-09 ENCOUNTER — Inpatient Hospital Stay: Payer: Self-pay | Admitting: Specialist

## 2014-11-09 DIAGNOSIS — I340 Nonrheumatic mitral (valve) insufficiency: Secondary | ICD-10-CM

## 2014-11-09 DIAGNOSIS — I499 Cardiac arrhythmia, unspecified: Secondary | ICD-10-CM

## 2014-11-09 DIAGNOSIS — N179 Acute kidney failure, unspecified: Secondary | ICD-10-CM

## 2014-11-09 DIAGNOSIS — I214 Non-ST elevation (NSTEMI) myocardial infarction: Secondary | ICD-10-CM

## 2014-11-09 DIAGNOSIS — I429 Cardiomyopathy, unspecified: Secondary | ICD-10-CM

## 2014-11-09 LAB — MAGNESIUM: Magnesium: 1.6 mg/dL — ABNORMAL LOW

## 2014-11-09 LAB — TROPONIN I
TROPONIN-I: 16 ng/mL — AB
Troponin-I: 14 ng/mL — ABNORMAL HIGH

## 2014-11-09 LAB — T4, FREE: Free Thyroxine: 1.13 ng/dL (ref 0.76–1.46)

## 2014-11-09 LAB — CK-MB
CK-MB: 77.3 ng/mL — ABNORMAL HIGH (ref 0.5–3.6)
CK-MB: 47.3 ng/mL — ABNORMAL HIGH (ref 0.5–3.6)

## 2014-11-10 DIAGNOSIS — I2510 Atherosclerotic heart disease of native coronary artery without angina pectoris: Secondary | ICD-10-CM

## 2014-11-10 HISTORY — PX: CARDIAC CATHETERIZATION: SHX172

## 2014-11-10 LAB — CBC WITH DIFFERENTIAL/PLATELET
Basophil #: 0 10*3/uL (ref 0.0–0.1)
Basophil %: 0.5 %
Eosinophil #: 0.2 10*3/uL (ref 0.0–0.7)
Eosinophil %: 3.4 %
HCT: 33.9 % — ABNORMAL LOW (ref 40.0–52.0)
HGB: 11.1 g/dL — ABNORMAL LOW (ref 13.0–18.0)
Lymphocyte #: 1.6 10*3/uL (ref 1.0–3.6)
Lymphocyte %: 32.1 %
MCH: 29.8 pg (ref 26.0–34.0)
MCHC: 32.7 g/dL (ref 32.0–36.0)
MCV: 91 fL (ref 80–100)
Monocyte #: 0.6 x10 3/mm (ref 0.2–1.0)
Monocyte %: 12.4 %
Neutrophil #: 2.6 10*3/uL (ref 1.4–6.5)
Neutrophil %: 51.6 %
Platelet: 153 10*3/uL (ref 150–440)
RBC: 3.72 10*6/uL — ABNORMAL LOW (ref 4.40–5.90)
RDW: 13.7 % (ref 11.5–14.5)
WBC: 5.1 10*3/uL (ref 3.8–10.6)

## 2014-11-10 LAB — LIPID PANEL
Cholesterol: 129 mg/dL (ref 0–200)
HDL Cholesterol: 46 mg/dL (ref 40–60)
Ldl Cholesterol, Calc: 69 mg/dL (ref 0–100)
Triglycerides: 68 mg/dL (ref 0–200)
VLDL Cholesterol, Calc: 14 mg/dL (ref 5–40)

## 2014-11-10 LAB — BASIC METABOLIC PANEL
Anion Gap: 7 (ref 7–16)
BUN: 19 mg/dL — ABNORMAL HIGH (ref 7–18)
Calcium, Total: 7.9 mg/dL — ABNORMAL LOW (ref 8.5–10.1)
Chloride: 109 mmol/L — ABNORMAL HIGH (ref 98–107)
Co2: 25 mmol/L (ref 21–32)
Creatinine: 1.43 mg/dL — ABNORMAL HIGH (ref 0.60–1.30)
EGFR (African American): >60
EGFR (Non-African Amer.): 53 — ABNORMAL LOW
Glucose: 90 mg/dL (ref 65–99)
Osmolality: 283 (ref 275–301)
Potassium: 4.1 mmol/L (ref 3.5–5.1)
Sodium: 141 mmol/L (ref 136–145)

## 2014-11-10 LAB — HEMOGLOBIN A1C: Hemoglobin A1C: 5.6 % (ref 4.2–6.3)

## 2014-11-11 ENCOUNTER — Telehealth: Payer: Self-pay

## 2014-11-11 LAB — BASIC METABOLIC PANEL
ANION GAP: 6 — AB (ref 7–16)
BUN: 16 mg/dL (ref 7–18)
CALCIUM: 8.1 mg/dL — AB (ref 8.5–10.1)
CHLORIDE: 108 mmol/L — AB (ref 98–107)
CREATININE: 1.34 mg/dL — AB (ref 0.60–1.30)
Co2: 27 mmol/L (ref 21–32)
EGFR (African American): >60
EGFR (Non-African Amer.): 57 — ABNORMAL LOW
Glucose: 89 mg/dL (ref 65–99)
Osmolality: 282 (ref 275–301)
Potassium: 4 mmol/L (ref 3.5–5.1)
Sodium: 141 mmol/L (ref 136–145)

## 2014-11-11 NOTE — Telephone Encounter (Signed)
[facility].  Patient understands to follow up with Eula Listenyan Dunn, PA on 11/11/14 at 1[facility].. Patient understands discharge instructions? yes Patient understands medications and regiment? yes Patient understands to bring all medications to this visit? yes

## 2014-11-11 NOTE — Telephone Encounter (Signed)
[facility] called and the pt is being discharged today.  A hosp follow up has been scheduled for Jan 9th

## 2014-11-12 ENCOUNTER — Encounter: Payer: Self-pay | Admitting: Cardiovascular Disease

## 2014-11-12 ENCOUNTER — Encounter: Payer: Self-pay | Admitting: *Deleted

## 2014-11-13 ENCOUNTER — Telehealth: Payer: Self-pay | Admitting: *Deleted

## 2014-11-13 NOTE — Telephone Encounter (Signed)
Called to complete TCM call, Left vm for pt to return my call

## 2014-11-17 ENCOUNTER — Encounter: Payer: Self-pay | Admitting: Internal Medicine

## 2014-11-17 DIAGNOSIS — I2129 ST elevation (STEMI) myocardial infarction involving other sites: Secondary | ICD-10-CM | POA: Insufficient documentation

## 2014-11-17 DIAGNOSIS — I214 Non-ST elevation (NSTEMI) myocardial infarction: Secondary | ICD-10-CM | POA: Insufficient documentation

## 2014-11-19 ENCOUNTER — Telehealth: Payer: Self-pay | Admitting: Internal Medicine

## 2014-11-19 NOTE — Telephone Encounter (Signed)
Please advise when and where you want this patient worked in for a hosp follow up. Patient was discharged on 12/22.

## 2014-11-19 NOTE — Telephone Encounter (Signed)
4:30 on January 6th. For hospital follow up

## 2014-11-19 NOTE — Telephone Encounter (Signed)
Pt scheduled and notified

## 2014-11-19 NOTE — Telephone Encounter (Signed)
Jan 6 4:30

## 2014-11-22 ENCOUNTER — Inpatient Hospital Stay: Payer: Self-pay | Admitting: Internal Medicine

## 2014-11-22 DIAGNOSIS — I255 Ischemic cardiomyopathy: Secondary | ICD-10-CM

## 2014-11-22 DIAGNOSIS — I2510 Atherosclerotic heart disease of native coronary artery without angina pectoris: Secondary | ICD-10-CM

## 2014-11-22 DIAGNOSIS — I472 Ventricular tachycardia: Secondary | ICD-10-CM

## 2014-11-22 LAB — COMPREHENSIVE METABOLIC PANEL
ALBUMIN: 3.3 g/dL — AB (ref 3.4–5.0)
ALK PHOS: 104 U/L
ALT: 37 U/L
AST: 32 U/L (ref 15–37)
Anion Gap: 7 (ref 7–16)
BILIRUBIN TOTAL: 0.5 mg/dL (ref 0.2–1.0)
BUN: 19 mg/dL — ABNORMAL HIGH (ref 7–18)
Calcium, Total: 8.3 mg/dL — ABNORMAL LOW (ref 8.5–10.1)
Chloride: 101 mmol/L (ref 98–107)
Co2: 27 mmol/L (ref 21–32)
Creatinine: 1.62 mg/dL — ABNORMAL HIGH (ref 0.60–1.30)
EGFR (Non-African Amer.): 46 — ABNORMAL LOW
GFR CALC AF AMER: 56 — AB
Glucose: 105 mg/dL — ABNORMAL HIGH (ref 65–99)
Osmolality: 273 (ref 275–301)
Potassium: 4.1 mmol/L (ref 3.5–5.1)
Sodium: 135 mmol/L — ABNORMAL LOW (ref 136–145)
TOTAL PROTEIN: 7.1 g/dL (ref 6.4–8.2)

## 2014-11-22 LAB — PROTIME-INR
INR: 2.8
Prothrombin Time: 28.6 secs — ABNORMAL HIGH (ref 11.5–14.7)

## 2014-11-22 LAB — CBC
HCT: 37 % — ABNORMAL LOW (ref 40.0–52.0)
HGB: 12.4 g/dL — AB (ref 13.0–18.0)
MCH: 30.3 pg (ref 26.0–34.0)
MCHC: 33.6 g/dL (ref 32.0–36.0)
MCV: 90 fL (ref 80–100)
PLATELETS: 233 10*3/uL (ref 150–440)
RBC: 4.1 10*6/uL — ABNORMAL LOW (ref 4.40–5.90)
RDW: 13.3 % (ref 11.5–14.5)
WBC: 7.1 10*3/uL (ref 3.8–10.6)

## 2014-11-22 LAB — TROPONIN I
Troponin-I: <0.02 ng/mL
Troponin-I: 0.07 ng/mL — ABNORMAL HIGH
Troponin-I: 0.24 ng/mL — ABNORMAL HIGH

## 2014-11-22 LAB — PRO B NATRIURETIC PEPTIDE: B-TYPE NATIURETIC PEPTID: 802 pg/mL — AB (ref 0–125)

## 2014-11-22 LAB — MAGNESIUM: Magnesium: 2 mg/dL

## 2014-11-22 LAB — CK TOTAL AND CKMB (NOT AT ARMC)
CK, Total: 61 U/L (ref 39–308)
CK-MB: 1 ng/mL (ref 0.5–3.6)

## 2014-11-22 LAB — APTT: Activated PTT: 51.8 secs — ABNORMAL HIGH (ref 23.6–35.9)

## 2014-11-23 LAB — CBC WITH DIFFERENTIAL/PLATELET
BASOS ABS: 0 10*3/uL (ref 0.0–0.1)
Basophil %: 0.5 %
EOS ABS: 0.2 10*3/uL (ref 0.0–0.7)
EOS PCT: 2.9 %
HCT: 34 % — AB (ref 40.0–52.0)
HGB: 11.2 g/dL — AB (ref 13.0–18.0)
Lymphocyte #: 1.5 10*3/uL (ref 1.0–3.6)
Lymphocyte %: 24.3 %
MCH: 30 pg (ref 26.0–34.0)
MCHC: 33.1 g/dL (ref 32.0–36.0)
MCV: 91 fL (ref 80–100)
MONOS PCT: 12.6 %
Monocyte #: 0.8 x10 3/mm (ref 0.2–1.0)
Neutrophil #: 3.7 10*3/uL (ref 1.4–6.5)
Neutrophil %: 59.7 %
Platelet: 193 10*3/uL (ref 150–440)
RBC: 3.75 10*6/uL — AB (ref 4.40–5.90)
RDW: 13.5 % (ref 11.5–14.5)
WBC: 6.2 10*3/uL (ref 3.8–10.6)

## 2014-11-23 LAB — LIPID PANEL
CHOLESTEROL: 112 mg/dL (ref 0–200)
HDL: 37 mg/dL — AB (ref 40–60)
Ldl Cholesterol, Calc: 56 mg/dL (ref 0–100)
TRIGLYCERIDES: 94 mg/dL (ref 0–200)
VLDL Cholesterol, Calc: 19 mg/dL (ref 5–40)

## 2014-11-25 ENCOUNTER — Encounter: Payer: BC Managed Care – PPO | Admitting: Physician Assistant

## 2014-11-26 ENCOUNTER — Ambulatory Visit: Payer: BC Managed Care – PPO | Admitting: Internal Medicine

## 2014-11-27 NOTE — Telephone Encounter (Signed)
error 

## 2014-11-28 ENCOUNTER — Ambulatory Visit: Payer: BC Managed Care – PPO | Admitting: Nurse Practitioner

## 2014-12-02 ENCOUNTER — Ambulatory Visit: Payer: Self-pay | Admitting: Internal Medicine

## 2014-12-05 ENCOUNTER — Encounter: Payer: Self-pay | Admitting: Cardiovascular Disease

## 2014-12-05 ENCOUNTER — Ambulatory Visit (INDEPENDENT_AMBULATORY_CARE_PROVIDER_SITE_OTHER): Payer: BLUE CROSS/BLUE SHIELD | Admitting: *Deleted

## 2014-12-05 ENCOUNTER — Ambulatory Visit (INDEPENDENT_AMBULATORY_CARE_PROVIDER_SITE_OTHER): Payer: BLUE CROSS/BLUE SHIELD | Admitting: Cardiovascular Disease

## 2014-12-05 VITALS — BP 110/68 | HR 58 | Ht 70.0 in | Wt 270.0 lb

## 2014-12-05 DIAGNOSIS — I255 Ischemic cardiomyopathy: Secondary | ICD-10-CM

## 2014-12-05 DIAGNOSIS — I5022 Chronic systolic (congestive) heart failure: Secondary | ICD-10-CM | POA: Diagnosis not present

## 2014-12-05 DIAGNOSIS — I214 Non-ST elevation (NSTEMI) myocardial infarction: Secondary | ICD-10-CM

## 2014-12-05 DIAGNOSIS — I472 Ventricular tachycardia, unspecified: Secondary | ICD-10-CM

## 2014-12-05 DIAGNOSIS — I4729 Other ventricular tachycardia: Secondary | ICD-10-CM

## 2014-12-05 DIAGNOSIS — I2129 ST elevation (STEMI) myocardial infarction involving other sites: Secondary | ICD-10-CM

## 2014-12-05 DIAGNOSIS — R002 Palpitations: Secondary | ICD-10-CM

## 2014-12-05 DIAGNOSIS — I10 Essential (primary) hypertension: Secondary | ICD-10-CM

## 2014-12-05 DIAGNOSIS — Z95810 Presence of automatic (implantable) cardiac defibrillator: Secondary | ICD-10-CM

## 2014-12-05 DIAGNOSIS — E669 Obesity, unspecified: Secondary | ICD-10-CM

## 2014-12-05 LAB — MDC_IDC_ENUM_SESS_TYPE_INCLINIC
Date Time Interrogation Session: 20160115050000
HighPow Impedance: 51 Ohm
Implantable Pulse Generator Serial Number: 101974
Lead Channel Impedance Value: 491 Ohm
Lead Channel Impedance Value: 642 Ohm
Lead Channel Impedance Value: 649 Ohm
Lead Channel Pacing Threshold Amplitude: 0.8 V
Lead Channel Pacing Threshold Amplitude: 1.5 V
Lead Channel Pacing Threshold Pulse Width: 0.5 ms
Lead Channel Pacing Threshold Pulse Width: 0.5 ms
Lead Channel Sensing Intrinsic Amplitude: 18.9 mV
Lead Channel Sensing Intrinsic Amplitude: 4.5 mV
Lead Channel Setting Pacing Amplitude: 2 V
Lead Channel Setting Pacing Amplitude: 2.4 V
Lead Channel Setting Pacing Pulse Width: 0.5 ms
Lead Channel Setting Pacing Pulse Width: 0.5 ms
MDC IDC MSMT BATTERY REMAINING LONGEVITY: 96 mo
MDC IDC MSMT LEADCHNL LV PACING THRESHOLD PULSEWIDTH: 0.5 ms
MDC IDC MSMT LEADCHNL LV SENSING INTR AMPL: 25 mV — AB
MDC IDC MSMT LEADCHNL RA PACING THRESHOLD AMPLITUDE: 0.5 V
MDC IDC SET LEADCHNL LV PACING AMPLITUDE: 2.3 V
MDC IDC SET LEADCHNL LV SENSING SENSITIVITY: 1 mV
MDC IDC SET LEADCHNL RV SENSING SENSITIVITY: 0.6 mV
MDC IDC SET ZONE DETECTION INTERVAL: 286 ms
MDC IDC STAT BRADY RA PERCENT PACED: 98 %
MDC IDC STAT BRADY RV PERCENT PACED: 98 %
Zone Setting Detection Interval: 364 ms

## 2014-12-05 NOTE — Assessment & Plan Note (Addendum)
He is currently taking amiodarone 200 mg twice a day. 30 day monitor has been ordered and should arrive early next week. Recent interrogation of his ICD did not show VT from January 2, likely as it was too slow and was not attempted. We recanted to call Dr. Graciela HusbandsKlein and Dr. Johney FrameAllred today with no success. He appears relatively euvolemic if not already dehydrated given recent creatinine 1.6. Electrolytes were within normal range on hospital visit 2 weeks ago. Suspect he may need VT ablation if symptoms recur. He has appointment with Dr. Graciela HusbandsKlein in [place]' [place]

## 2014-12-05 NOTE — Progress Notes (Signed)
Patient ID: Billy [name], male    DOB: 1951/08/20, 64 y.o.   MRN: [id number]  HPI Comments: Mr. Kimbrell is a pleasant 29 origin with obesity, coronary artery disease, previous MI in 53s with by the ICD, previous episodes of VT Fabry 2014 terminated with pacing presenting to the emergency room December 20 with heart rate 180, VT, treated with amiodarone infusion, troponin 15 with cardiac catheterization at that time showing stable severe coronary disease, anterior wall MI/scar, recurrent VT generally second 2016 again treated with amiodarone IV infusion. He presents for follow-up in the [place] office.  His first episode of VT lasted 3 and half hours before converting back to normal sinus rhythm on amiodarone. ER had given adenosine with no improvement of his symptoms. He was hypotensive while in VT but mentating well. On some 20th, anti-tachycardic pacing was delivered twice though did not terminate the VT. VT was then to slow for detection and shock was not delivered Parameters were changed while in the hospital and VT detection was lowered to 165 bpm, intervals changed  On today's visit, he reports going home on amiodarone 400 mg twice a day for 1 week then down to 200 mg twice a day On generally second he had recurrent VT. Again loaded with amiodarone IV, discharged on amiodarone 400 g twice a day for another week, then down to 200 mg twice a day. This past Monday, 5 days ago on January 12 he decrease the dose down to 200 mg twice a day. No tachycardia this week  ICD was interrogated today while in the office by Gunnar Fusi and VT was not detected on download. EKG shows VT with rate 143 bpm in the emergency room 11/22/2014. He was treated at that time by [facility]. In the hospital December 20 he did have a chest CT scan with no PE Echocardiogram showed ejection fraction 25-30%, severe anterior and septal, apical hypokinesis. Images suggestive of apical thrombus though not well imaged. He was  started on anticoagulation, mild to moderate MR   Cardiac catheterization 11/10/2014 showing severely subtotal occluded proximal LAD, left circumflex, RCA, and ramus patent with mild disease   lab work shows total cholesterol 112, LDL 56   EKG on today's visit shows paced rhythm at 58 bpm      No Known Allergies  Outpatient Encounter Prescriptions as of 12/05/2014  Medication Sig  . amiodarone (PACERONE) 200 MG tablet Take 200 mg by mouth 2 (two) times daily.   Marland Kitchen aspirin 81 MG tablet Take 81 mg by mouth daily.    Marland Kitchen atorvastatin (LIPITOR) 40 MG tablet Take 40 mg by mouth daily at 6 PM.   . carvedilol (COREG) 25 MG tablet Take 1 tablet (25 mg total) by mouth 2 (two) times daily.  . enalapril (VASOTEC) 20 MG tablet TAKE ONE-HALF TABLET BY MOUTH TWICE DAILY  . furosemide (LASIX) 40 MG tablet TAKE ONE TABLET BY MOUTH ONCE DAILY  . magnesium oxide (MAG-OX) 400 MG tablet Take 400 mg by mouth every other day.  . multivitamin (THERAGRAN) per tablet Take 1 tablet by mouth daily.    . nitroGLYCERIN (NITROSTAT) 0.4 MG SL tablet Place 1 tablet (0.4 mg total) under the tongue every 5 (five) minutes as needed for chest pain.  . Omega-3 Fatty Acids (FISH OIL) 1000 MG CAPS Take 1,000 mg by mouth every other day.   . potassium chloride SA (K-DUR,KLOR-CON) 20 MEQ tablet Take 20 mEq by mouth daily.   . Rivaroxaban (XARELTO) 15 MG  TABS tablet Take 15 mg by mouth daily with supper.  . [DISCONTINUED] atorvastatin (LIPITOR) 10 MG tablet Take 1 tablet (10 mg total) by mouth daily. (Patient not taking: Reported on 12/05/2014)    Past Medical History  Diagnosis Date  . Ischemic cardiomyopathy     s/p ICD implant 2008 with subsequent upgrade to CRTD 2014  . LBBB (left bundle branch block)   . Obesity   . Hypertension   . Ventricular tachycardia     syncopal  treated via ICD  . Chicken pox   . Heart disease   . Hyperlipidemia   . Kidney stone   . Gout   . Automatic implantable cardioverter-defibrillator  in situ   . Myocardial infarction 1991  . Arthritis     Past Surgical History  Procedure Laterality Date  . Implantable cardioverter defibrillator implant  2008    as part of MADIT-CRT trial - single chamber device  . Cholecystectomy    . Appendectomy  1955  . Knee surgery  2005  . Bi-ventricular implantable cardioverter defibrillator upgrade  10-16-2013    CRTD upgrade by Dr Graciela HusbandsKlein  . Icd generator change  10/16/2013    Dr Graciela HusbandsKlein  . Biv icd genertaor change out N/A 10/16/2013    Procedure: BIV ICD GENERTAOR CHANGE OUT;  Surgeon: Duke SalviaSteven C Klein, MD;  [facility];  Service: Cardiovascular;  Laterality: N/A;    Social History  reports that he quit smoking about 24 years ago. His smoking use included Cigarettes. He has a 30 pack-year smoking history. He has never used smokeless tobacco. He reports that he drinks about 1.5 oz of alcohol per week. He reports that he does not use illicit drugs.  Family History family history includes Arthritis in his mother; Heart disease in his father; Heart disease (age of onset: 5473) in his mother; Mental illness in his father.   Review of Systems  Constitutional: Positive for fatigue.  Respiratory: Negative.   Cardiovascular: Negative.   Gastrointestinal: Negative.   Musculoskeletal: Negative.   Skin: Negative.   Neurological: Negative.   Hematological: Negative.   Psychiatric/Behavioral: Negative.   All other systems reviewed and are negative.   BP 110/68 mmHg  Pulse 58  Ht 5\' 10"  (1.778 m)  Wt 270 lb (122.471 kg)  BMI 38.74 kg/m2  Physical Exam  Constitutional: He is oriented to person, place, and time. He appears well-developed and well-nourished.  HENT:  Head: Normocephalic.  Nose: Nose normal.  Mouth/Throat: Oropharynx is clear and moist.  Eyes: Conjunctivae are normal. Pupils are equal, round, and reactive to light.  Neck: Normal range of motion. Neck supple. No JVD present.  Cardiovascular: Normal rate, regular  rhythm, S1 normal, S2 normal, normal heart sounds and intact distal pulses.  Exam reveals no gallop and no friction rub.   No murmur heard. Pulmonary/Chest: Effort normal and breath sounds normal. No respiratory distress. He has no wheezes. He has no rales. He exhibits no tenderness.  Abdominal: Soft. Bowel sounds are normal. He exhibits no distension. There is no tenderness.  Musculoskeletal: Normal range of motion. He exhibits no edema or tenderness.  Lymphadenopathy:    He has no cervical adenopathy.  Neurological: He is alert and oriented to person, place, and time. Coordination normal.  Skin: Skin is warm and dry. No rash noted. No erythema.  Psychiatric: He has a normal mood and affect. His behavior is normal. Judgment and thought content normal.      Assessment and Plan  Nursing note and vitals reviewed.

## 2014-12-05 NOTE — Patient Instructions (Addendum)
You are doing well. No medication changes were made.  We will order a 30 day monitor to monitor the rhythm Take extra amiodarone 400 mg for any arrhythmia  Follow up with Dr. Graciela HusbandsKlein  Please call us if you have new issues that need to be addressed before your next appt.  Your physician wants you to follow-up in: 3 month.  You will receive a reminder letter in the mail two months in advance. If you don't receive a letter, please call our office to schedule the follow-up appointment.   Your physician has recommended that you wear an event monitor. Event monitors are medical devices that record the heart's electrical activity. Doctors most often us these monitors to diagnose arrhythmias. Arrhythmias are problems with the speed or rhythm of the heartbeat. The monitor is a small, portable device. You can wear one while you do your normal daily activities. This is usually used to diagnose what is causing palpitations/syncope (passing out).

## 2014-12-05 NOTE — Assessment & Plan Note (Signed)
Xarelto started for suspected apical thrombus.

## 2014-12-05 NOTE — Progress Notes (Signed)
Bi V ICD check in office. 

## 2014-12-05 NOTE — Assessment & Plan Note (Signed)
We have encouraged continued exercise, careful diet management in an effort to lose weight. 

## 2014-12-05 NOTE — Assessment & Plan Note (Signed)
Blood pressure is well controlled on today's visit. No changes made to the medications. 

## 2014-12-05 NOTE — Assessment & Plan Note (Signed)
Subtotally occluded LAD on recent catheterization, patent RCA, ramus, left circumflex. Anterior wall scar

## 2014-12-08 ENCOUNTER — Other Ambulatory Visit: Payer: Self-pay | Admitting: Internal Medicine

## 2014-12-09 ENCOUNTER — Ambulatory Visit (INDEPENDENT_AMBULATORY_CARE_PROVIDER_SITE_OTHER): Payer: BLUE CROSS/BLUE SHIELD | Admitting: Internal Medicine

## 2014-12-09 ENCOUNTER — Encounter: Payer: Self-pay | Admitting: Internal Medicine

## 2014-12-09 VITALS — BP 109/68 | HR 55 | Ht 70.0 in | Wt 274.5 lb

## 2014-12-09 DIAGNOSIS — I5022 Chronic systolic (congestive) heart failure: Secondary | ICD-10-CM

## 2014-12-09 DIAGNOSIS — I255 Ischemic cardiomyopathy: Secondary | ICD-10-CM

## 2014-12-09 LAB — MDC_IDC_ENUM_SESS_TYPE_INCLINIC
Lead Channel Setting Pacing Amplitude: 2 V
Lead Channel Setting Pacing Amplitude: 2.3 V
Lead Channel Setting Pacing Amplitude: 2.4 V
Lead Channel Setting Sensing Sensitivity: 0.6 mV
Lead Channel Setting Sensing Sensitivity: 1 mV
MDC IDC PG SERIAL: 101974
MDC IDC SESS DTM: 20160119050000
MDC IDC SET LEADCHNL LV PACING PULSEWIDTH: 0.5 ms
MDC IDC SET LEADCHNL RV PACING PULSEWIDTH: 0.5 ms
MDC IDC SET ZONE DETECTION INTERVAL: 462 ms
Zone Setting Detection Interval: 250 ms
Zone Setting Detection Interval: 316 ms

## 2014-12-09 NOTE — Patient Instructions (Signed)
Your physician recommends that you continue on your current medications as directed. Please refer to the Current Medication list given to you today. Your physician recommends that you schedule a follow-up appointment in:  5 weeks with Dr. Graciela HusbandsKlein

## 2014-12-09 NOTE — Progress Notes (Signed)
Electrophysiology Office Note   Date:  12/09/2014   ID:  MERLYN CONLEY, DOB 06-09-1951, MRN [id number]  PCP:  Sherlene Shams, MD  Cardiologist:  tg Primary Electrophysiologist:    Sherryl Manges, MD    Chief Complaint  Patient presents with  . other    OD 6 month f/u no complaints. Meds reviewed verbally with pt.     History of Present Illness: OTHON GUARDIA is a 64 y.o. male seen today as an add-on because of recent problems with ventricular tachycardia. In December he had an episode where he became short of breath and noted persistent tachycardia. EMS was called and his heart rate was 180. Device interrogation confirmed ventricular tachycardia. Amiodarone was initiated. Catheterization  12/15 demonstrated a 95% LAD stenosis   it is not clear to me what the intention was following this finding.  He had recurrent ventricular tachycardia in early January. The heart rate at that time was about 145. His device was programmed prior to that about 160.  He denies symptoms of palpitations, chest pain, shortness of breath, orthopnea, PND, lower extremity edema, claudication, dizziness, presyncope, syncope, bleeding, or neurologic sequela. The patient is tolerating medications without difficulties and is otherwise without complaint today.    Past Medical History  Diagnosis Date  . Ischemic cardiomyopathy     s/p ICD implant 2008 with subsequent upgrade to CRTD 2014  . LBBB (left bundle branch block)   . Obesity   . Hypertension   . Ventricular tachycardia     syncopal  treated via ICD  . Chicken pox   . Heart disease   . Hyperlipidemia   . Kidney stone   . Gout   . Automatic implantable cardioverter-defibrillator in situ   . Myocardial infarction 1991  . Arthritis    Past Surgical History  Procedure Laterality Date  . Implantable cardioverter defibrillator implant  2008    as part of MADIT-CRT trial - single chamber device  . Cholecystectomy    . Appendectomy  1955  .  Knee surgery  2005  . Bi-ventricular implantable cardioverter defibrillator upgrade  10-16-2013    CRTD upgrade by Dr Graciela Husbands  . Icd generator change  10/16/2013    Dr Graciela Husbands  . Biv icd genertaor change out N/A 10/16/2013    Procedure: BIV ICD GENERTAOR CHANGE OUT;  Surgeon: Duke Salvia, MD;  Location: [facility] CATH LAB;  Service: Cardiovascular;  Laterality: N/A;     Current Outpatient Prescriptions  Medication Sig Dispense Refill  . amiodarone (PACERONE) 200 MG tablet Take 400 mg by mouth 2 (two) times daily.     Marland Kitchen aspirin 81 MG tablet Take 81 mg by mouth daily.      Marland Kitchen atorvastatin (LIPITOR) 40 MG tablet Take 40 mg by mouth daily at 6 PM.     . carvedilol (COREG) 25 MG tablet Take 1 tablet (25 mg total) by mouth 2 (two) times daily. 180 tablet 3  . enalapril (VASOTEC) 20 MG tablet TAKE ONE-HALF TABLET BY MOUTH TWICE DAILY 90 tablet 0  . furosemide (LASIX) 40 MG tablet TAKE ONE TABLET BY MOUTH ONCE DAILY *NEEDS OFFICE VISIT BEFORE FURTHER REFILLS, CALL OFFICE TO SCHEDULE APPOINTMENT ASAP* 30 tablet 0  . magnesium oxide (MAG-OX) 400 MG tablet Take 400 mg by mouth every other day.    . multivitamin (THERAGRAN) per tablet Take 1 tablet by mouth daily.      . nitroGLYCERIN (NITROSTAT) 0.4 MG SL tablet Place 1 tablet (0.4 mg  total) under the tongue every 5 (five) minutes as needed for chest pain. 25 tablet 3  . Omega-3 Fatty Acids (FISH OIL) 1000 MG CAPS Take 1,000 mg by mouth every other day.     . potassium chloride SA (K-DUR,KLOR-CON) 20 MEQ tablet Take 20 mEq by mouth daily.     . Rivaroxaban (XARELTO) 15 MG TABS tablet Take 15 mg by mouth daily with supper.     No current facility-administered medications for this visit.    Allergies:   Review of patient's allergies indicates no known allergies.   Social History:  The patient  reports that he quit smoking about 24 years ago. His smoking use included Cigarettes. He has a 30 pack-year smoking history. He has never used smokeless tobacco. He  reports that he drinks about 1.5 oz of alcohol per week. He reports that he does not use illicit drugs.   Family History:  The patient's family history includes Arthritis in his mother; Heart disease in his father; Heart disease (age of onset: 76) in his mother; Mental illness in his father.    ROS:  Please see the history of present illness.   .   All other systems are reviewed and negative.    PHYSICAL EXAM: VS:  BP 109/68 mmHg  Pulse 55  Ht  (1.778 m)  Wt 274 lb 8 oz (124.512 kg)  BMI 39.39 kg/m2 , BMI Body mass index is 39.39 kg/(m^2). GEN: Well nourished, well developed, in no acute distress HEENT: normal Neck: no JVD, carotid bruits, or masses Cardiac: REGULAR RATE and RHYTHM ; 2/6  murmurs, rubs,  No S4  Back without kyphosis or CVAT Respiratory:  clear to auscultation bilaterally, normal work of breathing GI: soft, nontender, nondistended, + BS MS: no deformity or atrophy Extremities no clubbing cyanosis  edema Skin: warm and dry,  device pocket is well healed Neuro:  Strength and sensation are intact Psych: euthymic mood, full affect  EKG:  EKG is ordered today. The ekg ordered today shows nsr    Device interrogation is reviewed today in detail.  See PaceArt for details.   Recent Labs: 02/26/2014: ALT 25; BUN 19; Creatinine 1.1; Hemoglobin 11.9*; Platelets 205.0; Potassium 4.8; Sodium 135; TSH 1.66    Lipid Panel     Component Value Date/Time   CHOL 156 02/26/2014 1532   TRIG 67.0 02/26/2014 1532   HDL 63.00 02/26/2014 1532   CHOLHDL 2 02/26/2014 1532   VLDL 13.4 02/26/2014 1532   LDLCALC 80 02/26/2014 1532     Wt Readings from Last 3 Encounters:  12/09/14 274 lb 8 oz (124.512 kg)  12/05/14 270 lb (122.471 kg)  02/26/14 268 lb 4 oz (121.677 kg)      Other studies Reviewed: Additional studies/ records that were reviewed today include: none   Review of the above records today demonstrates: as abiove   ASSESSMENT AND PLAN:  Ventricular  tachycardia  Ischemic cardiomyopathy  Left ventricular apical thrombus  Implantable defibrillator-  [employer]  Morbid obesity  Congestive heart failure-chronic-systolic  The patient has had recurrent ventricular tachycardia. Amiodarone was initiated in December. He had ventricular tachycardia again thereafter. At this juncture we will decrease his amiodarone from 400 twice daily--400 daily. We will see him again in one month's time and at that point we'll hopefully decrease his dose still further. He will need pulmonary function testing because of the high-risk nature of this medication. We have reviewed side effects of this medication.  I spoke  with Dr. Knute NeuG to understand what the plan was about is 95% lesion. He will review with the interventional team as to whether it is appropriate to pursue intervention as ischemia could certainly change the electrical melieu to promote ventricular tachycardia.     Current medicines are reviewed at length with the patient today.   The patient does not have concerns regarding his medicines.  The following changes were made today:  Dose down titration as noted  Labs/ tests ordered today include:  Discontinuation of his event recorder. No orders of the defined types were placed in this encounter.     Disposition:   FU with me 1 month(s)  Signed, Sherryl MangesSteven Tionna Gigante, MD  12/09/2014 12:46 PM     [facility] [address] [place] [address] [phone] (office) [phone] (fax)

## 2014-12-26 ENCOUNTER — Encounter: Payer: Self-pay | Admitting: Internal Medicine

## 2015-01-09 ENCOUNTER — Other Ambulatory Visit: Payer: Self-pay

## 2015-01-09 MED ORDER — CARVEDILOL 25 MG PO TABS: 25.0000 mg | ORAL_TABLET | Freq: Two times a day (BID) | ORAL | Status: DC

## 2015-01-09 NOTE — Telephone Encounter (Signed)
Refill sent for carvedilol.  

## 2015-01-26 ENCOUNTER — Encounter: Payer: Self-pay | Admitting: *Deleted

## 2015-01-27 ENCOUNTER — Ambulatory Visit (INDEPENDENT_AMBULATORY_CARE_PROVIDER_SITE_OTHER): Payer: BLUE CROSS/BLUE SHIELD | Admitting: Internal Medicine

## 2015-01-27 ENCOUNTER — Encounter: Payer: Self-pay | Admitting: Internal Medicine

## 2015-01-27 VITALS — BP 110/70 | HR 55 | Ht 70.0 in | Wt 277.2 lb

## 2015-01-27 DIAGNOSIS — Z6839 Body mass index (BMI) 39.0-39.9, adult: Secondary | ICD-10-CM

## 2015-01-27 DIAGNOSIS — I255 Ischemic cardiomyopathy: Secondary | ICD-10-CM

## 2015-01-27 DIAGNOSIS — I472 Ventricular tachycardia, unspecified: Secondary | ICD-10-CM

## 2015-01-27 LAB — MDC_IDC_ENUM_SESS_TYPE_INCLINIC
Date Time Interrogation Session: 20160308050000
Implantable Pulse Generator Serial Number: 101974
Lead Channel Setting Pacing Amplitude: 2.3 V
Lead Channel Setting Pacing Pulse Width: 0.5 ms
Lead Channel Setting Sensing Sensitivity: 0.6 mV
MDC IDC SET LEADCHNL LV SENSING SENSITIVITY: 1 mV
MDC IDC SET LEADCHNL RA PACING AMPLITUDE: 2 V
MDC IDC SET LEADCHNL RV PACING AMPLITUDE: 2.4 V
MDC IDC SET LEADCHNL RV PACING PULSEWIDTH: 0.5 ms
MDC IDC SET ZONE DETECTION INTERVAL: 316 ms
MDC IDC SET ZONE DETECTION INTERVAL: 462 ms
Zone Setting Detection Interval: 250 ms

## 2015-01-27 MED ORDER — CARVEDILOL 25 MG PO TABS: ORAL_TABLET | ORAL | Status: DC

## 2015-01-27 MED ORDER — SPIRONOLACTONE 25 MG PO TABS: 25.0000 mg | ORAL_TABLET | Freq: Every day | ORAL | Status: DC

## 2015-01-27 MED ORDER — ATORVASTATIN CALCIUM 40 MG PO TABS: ORAL_TABLET | ORAL | Status: DC

## 2015-01-27 MED ORDER — FUROSEMIDE 40 MG PO TABS: ORAL_TABLET | ORAL | Status: DC

## 2015-01-27 NOTE — Patient Instructions (Signed)
Your physician has recommended you make the following change in your medication:  Stop Xarelto  Stop Potassium  Start Aldactone 25 mg once daily  Change Coreg to 25 mg in the morning and 12.5 mg at night  Change Lipitor to 40 mg three times a week  Change Lasix to 40 mg twice daily every other day alternating with 40 mg once daily in between   Your physician recommends that you return for lab work in:  BMP and sed rate in 3 days  BMP in 14 days  BMP in 6 weeks   Your physician has recommended that you have a pulmonary function test. Pulmonary Function Tests are a group of tests that measure how well air moves in and out of your lungs.  Your physician recommends that you schedule a follow-up appointment in:  4 months with Dr. Graciela HusbandsKlein.   Remote monitoring is used to monitor your Pacemaker of ICD from home. This monitoring reduces the number of office visits required to check your device to one time per year. It allows us to keep an eye on the functioning of your device to ensure it is working properly. You are scheduled for a device check from home on 04/28/15. You may send your transmission at any time that day. If you have a wireless device, the transmission will be sent automatically. After your physician reviews your transmission, you will receive a postcard with your next transmission date.

## 2015-01-27 NOTE — Progress Notes (Signed)
Electrophysiology Office Note   Date:  01/27/2015   ID:  Billy [name], DOB 27-Feb-1951, MRN [id number]  PCP:  Sherlene Shams, MD  Cardiologist:  tg Primary Electrophysiologist:    Sherryl Manges, MD    Chief Complaint  Patient presents with  . other    5 wk f/u no complaints. Meds reviewed verbally with pt.     History of Present Illness: Billy [name] is a 64 y.o. male seen today as an add-on because of recent problems with ventricular tachycardia. In December he had an episode where he became short of breath and noted persistent tachycardia. EMS was called and his heart rate was 180. Device interrogation confirmed ventricular tachycardia. Amiodarone was initiated. Catheterization  12/15 demonstrated a 95% LAD stenosis   it is not clear to me what the intention was following this finding.  He had recurrent ventricular tachycardia in early January. The heart rate at that time was about 145. His device was programmed prior to that about 160. Most recently he was seen in late January at which time his amiodarone dose was decreased.  Dr. Knute Neu was to review with interventional cardiology plans to the LAD lesion  He comes in with complaints of dyspnea on exertion which is accompanied by swelling, ecchymoses that have evolved since he started taking the Rivaroxaban and pain in his wrist since the up titration of his statin. He alsohaving vivid  dreams .r   Past Medical History  Diagnosis Date  . Ischemic cardiomyopathy     s/p ICD implant 2008 with subsequent upgrade to CRTD 2014  . LBBB (left bundle branch block)   . Obesity   . Hypertension   . Ventricular tachycardia     syncopal  treated via ICD  . Chicken pox   . Heart disease   . Hyperlipidemia   . Kidney stone   . Gout   . Automatic implantable cardioverter-defibrillator in situ   . Myocardial infarction 1991  . Arthritis    Past Surgical History  Procedure Laterality Date  . Implantable cardioverter defibrillator  implant  2008    as part of MADIT-CRT trial - single chamber device  . Cholecystectomy    . Appendectomy  1955  . Knee surgery  2005  . Bi-ventricular implantable cardioverter defibrillator upgrade  10-16-2013    CRTD upgrade by Dr Graciela Husbands  . Icd generator change  10/16/2013    Dr Graciela Husbands  . Biv icd genertaor change out N/A 10/16/2013    Procedure: BIV ICD GENERTAOR CHANGE OUT;  Surgeon: Duke Salvia, MD;  Location: [facility] CATH LAB;  Service: Cardiovascular;  Laterality: N/A;  . Cardiac catheterization  11/10/2014    [facility]     Current Outpatient Prescriptions  Medication Sig Dispense Refill  . amiodarone (PACERONE) 200 MG tablet Take 200 mg by mouth daily.     Marland Kitchen aspirin 81 MG tablet Take 81 mg by mouth daily.      Marland Kitchen atorvastatin (LIPITOR) 40 MG tablet Take 40 mg by mouth daily at 6 PM.     . carvedilol (COREG) 25 MG tablet Take 1 tablet (25 mg total) by mouth 2 (two) times daily. 180 tablet 3  . enalapril (VASOTEC) 20 MG tablet TAKE ONE-HALF TABLET BY MOUTH TWICE DAILY 90 tablet 0  . furosemide (LASIX) 40 MG tablet TAKE ONE TABLET BY MOUTH ONCE DAILY *NEEDS OFFICE VISIT BEFORE FURTHER REFILLS, CALL OFFICE TO SCHEDULE APPOINTMENT ASAP* 30 tablet 0  . magnesium oxide (MAG-OX) 400  MG tablet Take 400 mg by mouth every other day.    . multivitamin (THERAGRAN) per tablet Take 1 tablet by mouth daily.      . nitroGLYCERIN (NITROSTAT) 0.4 MG SL tablet Place 1 tablet (0.4 mg total) under the tongue every 5 (five) minutes as needed for chest pain. 25 tablet 3  . Omega-3 Fatty Acids (FISH OIL) 1000 MG CAPS Take 1,000 mg by mouth every other day.     . potassium chloride SA (K-DUR,KLOR-CON) 20 MEQ tablet Take 20 mEq by mouth daily.     . Rivaroxaban (XARELTO) 15 MG TABS tablet Take 15 mg by mouth daily with supper.     No current facility-administered medications for this visit.    Allergies:   Review of patient's allergies indicates no known allergies.   Social History:  The patient  reports that  he quit smoking about 24 years ago. His smoking use included Cigarettes. He has a 30 pack-year smoking history. He has never used smokeless tobacco. He reports that he drinks about 1.5 oz of alcohol per week. He reports that he does not use illicit drugs.   Family History:  The patient's family history includes Arthritis in his mother; Heart disease in his father; Heart disease (age of onset: 7573) in his mother; Mental illness in his father.    ROS:  Please see the history of present illness.   .   All other systems are reviewed and negative.    PHYSICAL EXAM: VS:  BP 110/70 mmHg  Pulse 55  Ht 5\' 10"  (1.778 m)  Wt 277 lb 4 oz (125.76 kg)  BMI 39.78 kg/m2 , BMI Body mass index is 39.78 kg/(m^2). GEN: Well nourished, well developed, in no acute distress HEENT: normal Neck: n8 cm JVD, carotid bruits, or masses Cardiac: REGULAR RATE and RHYTHM ; 2/6  murmurs, rubs,  No S4  Back without kyphosis or CVAT Respiratory:  clear to auscultation bilaterally, normal work of breathing GI: soft, nontender, nondistended, + BS MS: no deformity or atrophy Extremities no clubbing cyanosis  2+edema Skin: warm and dry,  device pocket is well healed Neuro:  Strength and sensation are intact Psych: euthymic mood, full affect  EKG:  EKG is ordered today. The ekg ordered today shows nsr    Device interrogation is reviewed today in detail.  See PaceArt for details.   Recent Labs: 02/26/2014: ALT 25; BUN 19; Creatinine 1.1; Hemoglobin 11.9*; Platelets 205.0; Potassium 4.8; Sodium 135; TSH 1.66    Lipid Panel     Component Value Date/Time   CHOL 156 02/26/2014 1532   TRIG 67.0 02/26/2014 1532   HDL 63.00 02/26/2014 1532   CHOLHDL 2 02/26/2014 1532   VLDL 13.4 02/26/2014 1532   LDLCALC 80 02/26/2014 1532     Wt Readings from Last 3 Encounters:  01/27/15 277 lb 4 oz (125.76 kg)  12/09/14 274 lb 8 oz (124.512 kg)  12/05/14 270 lb (122.471 kg)      Other studies Reviewed: Additional studies/  records that were reviewed today include: none   Review of the above records today demonstrates: as abiove   ASSESSMENT AND PLAN:  Ventricular tachycardia  Ischemic cardiomyopathy  Left ventricular apical thrombus  Implantable defibrillator-  [employer]  Morbid obesity  Congestive heart failure- acute on chronic-systolic  Ecchymoses  Wrist pain  Dream Disturbances  The patient has had  no documented recurrent ventricular tachycardi although he did have that one episode of prolonged palpitations at about 115 bpm.  Amiodarone was initiated in December. He had ventricular tachycardia again thereafter. At this juncture we will continue his amiodarone at 200  We will see him again in one month's time and at that point we'll hopefully decrease his dose still further. He will pusue pulmonary function testing because of the high-risk nature of this medication. We have reviewed side effects of this medication.  His dyspnea is concerning. He is volume overloaded and 7-10 pounds heavier than he was 2 months ago. We will augment his diuresis going from 40 daily to 40/80 alternating.  We will also add spironolactone and will check a metabolic profile on day 3, day 14 and when he comes to see Dr. Knute Neu. In the interim we will discontinue his potassium The patient is aware that we had a 10 pound loss weight goal. We have reviewed side effects  The other concern is amiodarone lung toxicity. We will check a sedimentation rate as well as the PFTs as noted.  He is having wrist pain. He wonders whether it is his Lipitor as it occurred with up titration. I will have him hold his Lipitor for 4 weeks. We will then resume it 3 times a week if the pain has abated.  He is having significant bleeding on the Rivaroxaban;'s I am not aware of any data regarding the long-term value of anticoagulation for LV thrombus after the first 3 months. With the bleeding then, I will take the liberty of discontinuing it.     dream disturbances can be seen with beta blockers. This is probably particularly true of those that are lipophilic. Carvedilol and metoprolol unfortunately are both this way; bisoprolol is about 50/50. At first blush we will decrease his evening carvedilol from 25--12.5   He is to see TG to understand  and they will need to clarify that Plan  about is  residual 95% lesion.    Current medicines are reviewed at length with the patient today.   The patient does not  concerns regarding his medicines.  The following changes were made today:  Dose down titration as noted  Labs/ tests ordered today include:    Orders Placed This Encounter  Procedures  . EKG 12-Lead     Disposition:   FU with me 4  month(s)  Signed, Sherryl Manges, MD  01/27/2015 8:36 AM     [facility] [address] [place] [address] [phone] (office) [phone] (fax)

## 2015-01-28 ENCOUNTER — Ambulatory Visit (INDEPENDENT_AMBULATORY_CARE_PROVIDER_SITE_OTHER): Payer: BLUE CROSS/BLUE SHIELD | Admitting: Internal Medicine

## 2015-01-28 DIAGNOSIS — I255 Ischemic cardiomyopathy: Secondary | ICD-10-CM

## 2015-01-28 DIAGNOSIS — I472 Ventricular tachycardia, unspecified: Secondary | ICD-10-CM

## 2015-01-28 LAB — PULMONARY FUNCTION TEST
DL/VA % pred: 85 %
DL/VA: 3.93 ml/min/mmHg/L
DLCO unc % pred: 72 %
DLCO unc: 23.48 ml/min/mmHg
FEF 25-75 Post: 2.43 L/sec
FEF 25-75 Pre: 2.91 L/sec
FEF2575-%CHANGE-POST: -16 %
FEF2575-%PRED-POST: 87 %
FEF2575-%Pred-Pre: 104 %
FEV1-%Change-Post: -3 %
FEV1-%PRED-PRE: 81 %
FEV1-%Pred-Post: 78 %
FEV1-POST: 2.73 L
FEV1-PRE: 2.84 L
FEV1FVC-%Change-Post: -2 %
FEV1FVC-%Pred-Pre: 107 %
FEV6-%Change-Post: -1 %
FEV6-%PRED-PRE: 80 %
FEV6-%Pred-Post: 78 %
FEV6-Post: 3.48 L
FEV6-Pre: 3.53 L
FEV6FVC-%Pred-Post: 105 %
FEV6FVC-%Pred-Pre: 105 %
FVC-%Change-Post: -1 %
FVC-%Pred-Post: 75 %
FVC-%Pred-Pre: 76 %
FVC-PRE: 3.53 L
FVC-Post: 3.48 L
Post FEV1/FVC ratio: 78 %
Post FEV6/FVC ratio: 100 %
Pre FEV1/FVC ratio: 80 %
Pre FEV6/FVC Ratio: 100 %
RV % pred: 95 %
RV: 2.22 L
TLC % pred: 88 %
TLC: 6.24 L

## 2015-01-30 ENCOUNTER — Other Ambulatory Visit (INDEPENDENT_AMBULATORY_CARE_PROVIDER_SITE_OTHER): Payer: BLUE CROSS/BLUE SHIELD

## 2015-01-30 DIAGNOSIS — I255 Ischemic cardiomyopathy: Secondary | ICD-10-CM

## 2015-01-30 DIAGNOSIS — I472 Ventricular tachycardia, unspecified: Secondary | ICD-10-CM

## 2015-01-31 LAB — SEDIMENTATION RATE: SED RATE: 19 mm/h (ref 0–30)

## 2015-01-31 LAB — BASIC METABOLIC PANEL
BUN/Creatinine Ratio: 14 (ref 10–22)
BUN: 25 mg/dL (ref 8–27)
CO2: 23 mmol/L (ref 18–29)
CREATININE: 1.73 mg/dL — AB (ref 0.76–1.27)
Calcium: 8.8 mg/dL (ref 8.6–10.2)
Chloride: 100 mmol/L (ref 97–108)
GFR calc non Af Amer: 41 mL/min/{1.73_m2} — ABNORMAL LOW (ref >59)
GFR, EST AFRICAN AMERICAN: 48 mL/min/{1.73_m2} — AB (ref >59)
Glucose: 98 mg/dL (ref 65–99)
POTASSIUM: 4.5 mmol/L (ref 3.5–5.2)
Sodium: 138 mmol/L (ref 134–144)

## 2015-02-04 NOTE — Progress Notes (Signed)
[facility]

## 2015-02-09 ENCOUNTER — Telehealth: Payer: Self-pay | Admitting: *Deleted

## 2015-02-09 MED ORDER — AMIODARONE HCL 200 MG PO TABS: 200.0000 mg | ORAL_TABLET | Freq: Every day | ORAL | Status: DC

## 2015-02-09 NOTE — Telephone Encounter (Signed)
Refill sent for amiodarone 

## 2015-02-09 NOTE — Telephone Encounter (Signed)
°  1. Which medications need to be refilled? Amiodarone HCl   2. Which pharmacy is medication to be sent to? [facility] on [address]  3. Do they need a 30 day or 90 day supply? Not sure   4. Would they like a call back once the medication has been sent to the pharmacy? Yes please.

## 2015-02-10 ENCOUNTER — Other Ambulatory Visit (INDEPENDENT_AMBULATORY_CARE_PROVIDER_SITE_OTHER): Payer: BLUE CROSS/BLUE SHIELD

## 2015-02-10 DIAGNOSIS — I472 Ventricular tachycardia, unspecified: Secondary | ICD-10-CM

## 2015-02-10 DIAGNOSIS — I255 Ischemic cardiomyopathy: Secondary | ICD-10-CM

## 2015-02-11 LAB — BASIC METABOLIC PANEL
BUN/Creatinine Ratio: 18 (ref 10–22)
BUN: 38 mg/dL — AB (ref 8–27)
CHLORIDE: 98 mmol/L (ref 97–108)
CO2: 20 mmol/L (ref 18–29)
Calcium: 8.6 mg/dL (ref 8.6–10.2)
Creatinine, Ser: 2.17 mg/dL — ABNORMAL HIGH (ref 0.76–1.27)
GFR calc non Af Amer: 31 mL/min/{1.73_m2} — ABNORMAL LOW (ref >59)
GFR, EST AFRICAN AMERICAN: 36 mL/min/{1.73_m2} — AB (ref >59)
GLUCOSE: 93 mg/dL (ref 65–99)
Potassium: 4.5 mmol/L (ref 3.5–5.2)
Sodium: 134 mmol/L (ref 134–144)

## 2015-03-06 ENCOUNTER — Ambulatory Visit: Payer: BLUE CROSS/BLUE SHIELD | Admitting: Cardiovascular Disease

## 2015-03-06 NOTE — Progress Notes (Signed)
Darla LeschesMandi Moody, RN spoke with patient regarding labs.

## 2015-03-10 ENCOUNTER — Other Ambulatory Visit (INDEPENDENT_AMBULATORY_CARE_PROVIDER_SITE_OTHER): Payer: BLUE CROSS/BLUE SHIELD

## 2015-03-10 DIAGNOSIS — I255 Ischemic cardiomyopathy: Secondary | ICD-10-CM

## 2015-03-10 DIAGNOSIS — I472 Ventricular tachycardia, unspecified: Secondary | ICD-10-CM

## 2015-03-11 ENCOUNTER — Other Ambulatory Visit: Payer: Self-pay | Admitting: Internal Medicine

## 2015-03-11 LAB — BASIC METABOLIC PANEL
BUN / CREAT RATIO: 14 (ref 10–22)
BUN: 24 mg/dL (ref 8–27)
CO2: 20 mmol/L (ref 18–29)
Calcium: 8.5 mg/dL — ABNORMAL LOW (ref 8.6–10.2)
Chloride: 103 mmol/L (ref 97–108)
Creatinine, Ser: 1.71 mg/dL — ABNORMAL HIGH (ref 0.76–1.27)
GFR calc non Af Amer: 41 mL/min/{1.73_m2} — ABNORMAL LOW (ref >59)
GFR, EST AFRICAN AMERICAN: 48 mL/min/{1.73_m2} — AB (ref >59)
Glucose: 88 mg/dL (ref 65–99)
Potassium: 5.4 mmol/L — ABNORMAL HIGH (ref 3.5–5.2)
Sodium: 135 mmol/L (ref 134–144)

## 2015-03-11 MED ORDER — ENALAPRIL MALEATE 20 MG PO TABS: ORAL_TABLET | ORAL | Status: DC

## 2015-03-14 NOTE — Consult Note (Signed)
General Aspect 64 year old Caucasian male with hx of ischemic cardiomyoapthy, EF 20 to 25 % in 2014, previous MI in 1990s, BiVICD, episode of ventricular tachycardia February 2014 terminated with antitachycardia pacing, who presents to the Emergency Department via EMS for heart rate in the 180s. Cardiology consulted for arrhythmia, NSTEMI.  The patient's symptoms began as he was walking up the steps into his home. He began to feel dizzy and sat down, but at that time his right upper extremity began to hurt. He experienced some chest tightness, but denies any explicit pain. He admits to becoming clammy and diaphoretic. This began approximately 3-1/2 hours prior to conversion back to NSR in the ER.  Over the phone last night, ER physician felt the rhythm was SVT. Adenosine 6, 12, 12 did not break the rhythm. He was hypotensive, pressures 70 to 80 systolic.unable to give b-blocker or Ca channels blockers.  He received 4 liters of fluid resuscitation for hypotension.   After  numerous discussions on the phone last night with the ER, he was started on amiodarone, given digoxin 0.5 mg IV push x 1.  Then started on amiodarone drip. CTA chest in the ER with no PE.  Cardioversion was discussed and discouraged due to possible worsening hypotension with sedation. His systolic blood pressure got to the high 70s, at which time Levophed was started.  Troponin this AM is 16. He is pain free this AM. No SOB or edema.   Present Illness . Past Medical History?? ????? Ischemic cardiomyopathy?? ?? ?? ?? s/p ICD implant 2008 with subsequent upgrade to CRTD 2014?? ????? LBBB (left bundle branch block)?? ?? ????? Obesity?? ?? ????? Hypertension?? ?? ????? Ventricular tachycardia?? ?? ?? ?? syncopal?? treated via ICD?? ????? Chicken pox?? ?? ????? Heart disease?? ?? ????? Hyperlipidemia?? ?? ????? Kidney stone?? ?? ????? Gout?? ?? ????? Automatic implantable cardioverter-defibrillator in  situ?? ?? ????? Myocardial infarction?? 1991?? ????? Arthritis?? ??  ???? Past Surgical History?? ????? Implantable cardioverter defibrillator implant?? ?? 2008?? ?? ?? as part of MADIT-CRT trial - single chamber device?? ????? Cholecystectomy?? ?? ?? ????? Appendectomy?? ?? 1955?? ????? Knee surgery?? ?? 2005?? ????? Bi-ventricular implantable cardioverter defibrillator upgrade?? ?? 10-16-2013?? ?? ?? CRTD upgrade by Dr Caryl Comes?? ????? Icd generator change?? ?? 10/16/2013?? ?? ?? Dr Caryl Comes  Social History ????? Marital Status:?? Married?? ???? Social History Main Topics?? ????? Smoking status:?? Former Smoker -- 1.00 packs/day for 30 years?? ?? ?? Types:?? Cigarettes?? ?? ?? Quit date:?? 06/08/1990?? ????? Smokeless tobacco:?? No?? ?? ?? ?? Comment: Tobacco use-no?? ????? Alcohol Use:?? 1.5 oz/week?? ?? ?? 3 drink(s) per week?? ?? ?? ?? Comment: Occasional?? ????? Drug Use:?? No   Physical Exam:  GEN well developed, well nourished, no acute distress   HEENT hearing intact to voice   NECK supple   RESP normal resp effort  clear BS   CARD Regular rate and rhythm  Murmur   Murmur Systolic   ABD denies tenderness  soft   LYMPH negative neck   EXTR negative edema   SKIN normal to palpation   NEURO motor/sensory function intact   PSYCH alert, A+O to time, place, person, good insight   Review of Systems:  Subjective/Chief Complaint tired, SOB, arm pain   General: No Complaints   Skin: No Complaints   ENT: No Complaints   Eyes: No Complaints   Neck: No Complaints   Respiratory: No Complaints   Cardiovascular: Chest pain or discomfort  Tightness  resolved   Gastrointestinal: No Complaints   Genitourinary: No  Complaints   Vascular: No Complaints   Musculoskeletal: No Complaints   Neurologic: No Complaints   Hematologic: No Complaints   Endocrine: No Complaints   Psychiatric: No Complaints   Review of Systems: All other systems were  reviewed and found to be negative   Medications/Allergies Reviewed Medications/Allergies reviewed   Family & Social History:  Family and Social History:  Family History Coronary Artery Disease    Social History positive  tobacco   + Tobacco Prior (greater than 1 year)  25 yrs total    Place of Living Home    Home Medications: Medication Instructions Status  enalapril 10 mg oral tablet 1 tab(s) orally 2 times a day Active  furosemide 40 mg oral tablet 1 tab(s) orally once a day Active  aspirin 81 mg oral delayed release tablet 1 tab(s) orally once a day Active  Fish Oil 1000 mg oral capsule 1 tab(s) orally 3 times a week Active  magnesium oxide 400 mg oral tablet 1 tab(s) orally 3 to 4 times a week Active  potassium chloride 10 mEq oral tablet, extended release  orally once a day Active  Coreg 25 mg oral tablet 1 tab(s) orally 2 times a day Active  Lipitor 10 mg oral tablet 1 tab(s) orally once a day (at bedtime) Active   Lab Results:  Thyroid:  19-Dec-15 20:46   Thyroxine, Free 1.13 (Result(s) reported on 09 Nov 2014 at 03:01AM.)  Thyroid Stimulating Hormone  4.63 (0.45-4.50 (IU = International Unit)  ----------------------- Pregnant patients have  different reference  ranges for TSH:  - - - - - - - - - -  Pregnant, first trimetser:  0.36 - 2.50 uIU/mL)  Routine Chem:  19-Dec-15 20:46   Glucose, Serum  116  BUN 18  Creatinine (comp)  1.49  Sodium, Serum 138  Potassium, Serum 4.1  Chloride, Serum 103  CO2, Serum 26  Calcium (Total), Serum  8.3  Anion Gap 9  Osmolality (calc) 279  eGFR (African American) >60  eGFR (Non-African American)  51 (eGFR values <77m/min/1.73 m2 may be an indication of chronic kidney disease (CKD). Calculated eGFR, using the MRDR Study equation, is useful in  patients with stable renal function. The eGFR calculation will not be reliable in acutely ill patients when serum creatinine is changing rapidly. It is not useful in patients on  dialysis. The eGFR calculation may not be applicable to patients at the low and high extremes of body sizes, pregnant women, and vegetarians.)  Cardiac:  19-Dec-15 20:46   Troponin I < 0.02 (0.00-0.05 0.05 ng/mL or less: NEGATIVE  Repeat testing in 3-6 hrs  if clinically indicated. >0.05 ng/mL: POTENTIAL  MYOCARDIAL INJURY. Repeat  testing in 3-6 hrs if  clinically indicated. NOTE: An increase or decrease  of 30% or more on serial  testing suggests a  clinically important change)  20-Dec-15 09:24   Troponin I  16.00 (0.00-0.05 0.05 ng/mL or less: NEGATIVE  Repeat testing in 3-6 hrs  if clinically indicated. >0.05 ng/mL: POTENTIAL  MYOCARDIAL INJURY. Repeat  testing in 3-6 hrs if  clinically indicated. NOTE: An increase or decrease  of 30% or more on serial  testing suggests a  clinically important change)  Routine Hem:  19-Dec-15 20:46   WBC (CBC) 7.1  RBC (CBC) 4.42  Hemoglobin (CBC) 13.1  Hematocrit (CBC) 40.5  Platelet Count (CBC) 210 (Result(s) reported on 08 Nov 2014 at 09:37PM.)  MCV 92  MCH 29.6  MCHC 32.3  RDW 13.3  EKG:  Interpretation EKG with wide comples tachycardia, rate 170 bpm   Radiology Results: XRay:    19-Dec-15 21:40, Chest Portable Single View  Chest Portable Single View   REASON FOR EXAM:    chest pain  COMMENTS:       PROCEDURE: DXR - DXR PORTABLE CHEST SINGLE VIEW  - Nov 08 2014  9:40PM     CLINICAL DATA:  Lightheaded and dizzy after exertion.  Chest pain.    EXAM:  PORTABLE CHEST - 1 VIEW    COMPARISON:  None.    FINDINGS:  Underpenetration of the radiograph demonstrates apparent AICD.  Integrity of the leads and placement not established with this exam.  The heart is enlarged and there is mild vascular congestion.     IMPRESSION:  Underpenetrated radiograph without definite acute findings.      Electronically Signed    By: Rolla Flatten M.D.    On: 11/08/2014 22:00         Verified By: Staci Righter, M.D.,     NKDA: None  Vital Signs/Nurse's Notes: **Vital Signs.:   20-Dec-15 12:00  Vital Signs Type Routine  Pulse Pulse 57  Pulse source if not from Vital Sign Device per cardiac monitor  Respirations Respirations 16  Systolic BP Systolic BP 97  Diastolic BP (mmHg) Diastolic BP (mmHg) 54  Mean BP 68  Pulse Ox % Pulse Ox % 99  Oxygen Delivery Room Air/ 21 %    Impression 64 year old Caucasian male with hx of ischemic cardiomyoapthy, EF 20 to 25 % in 2014, BiVICD, episode of ventricular tachycardia February 2014 terminated with antitachycardia pacing, who presents to the Emergency Department via EMS for heart rate in the 180s. Cardiology consulted for arrhythmia, NSTEMI.  1) Arrhythmia concerning for sustained VT, cause of hypotension ICD rep to interrogate. I have called them this AM, they will come today to [place] broke with amiodarone infusion. Will continue high rate amiodarone infusion for now uncertain if ICD functioning appropriately or VT too slow to shock (or tracking atrial rhythm).  2) NSTEMI: troponin up to 16 this AM. Uncertain if he has had a primary ischemic event or tachycardia related NSTEMI no recent cath --Echo ordered to evaluate EF (prior in 2014 was 20 to 25%) --cardiac cath tomorrow to rule out workening CAD heparin infuision started.  3)CAD coronary anatomy unclear,  last cath in the 1990s per the patient no recent stress  --now with elevatead troponin/NSTEMI  4)cardiomyopathy EF 20 to 25%m, ischemic per the notes. Will heplock IV fluids He did receive significant fluids in the ER Will monitor for sx of CHF closely  5) Acute renal failure creatinine 1.0 at baseline now 1.59 will monitor, possible ATN from hypotension   Electronic Signatures: Ida Rogue (MD)  (Signed 20-Dec-15 12:27)  Authored: General Aspect/Present Illness, History and Physical Exam, Review of System, Family & Social History, Home Medications, Labs, EKG , Radiology,  Allergies, Vital Signs/Nurse's Notes, Impression/Plan   Last Updated: 20-Dec-15 12:27 by Ida Rogue (MD)

## 2015-03-14 NOTE — Discharge Summary (Signed)
PATIENT NAME:  Billy [name], Billy [name] MR#:  [id number] DATE OF BIRTH:  [birth year]  For a detailed note, please check the history and physical done on admission by Dr. Joycelyn RuaMichael Diamond.   DIAGNOSES AT DISCHARGE: 1.  Ventricular tachycardia.  2.  Non-ST elevation myocardial infarction secondary to nonsustained ventricular tachycardia.  3.  Cardiomyopathy, ejection fraction of 25%, status post automatic implantable cardioverter- defibrillator.  4.  Apical mural thrombus.  5.  Hypertension.  6.  Hyperlipidemia. 7.  History of systolic congestive heart failure.   DIET:  The patient is being discharged on a low-sodium, low-fat diet.   ACTIVITY: As tolerated.   FOLLOWUP: With Dr. Julien Nordmannimothy Gollan in the next 1-2 weeks. Also follow up with Dr. Duncan Dulleresa Tullo in the next 1-2 weeks.   DISCHARGE MEDICATIONS: Enalapril 10 mg b.i.d., Lasix 40 mg daily, aspirin 81 mg daily, fish oil 1000 mg t.i.d. weekly, magnesium oxide 400 mg 3-4 times weekly, potassium 10 mEq daily, Coreg 25 mg b.i.d., atorvastatin 40 mg daily, amiodarone 400 mg b.i.d. x 5 days then to start amiodarone 200 mg b.i.d., Xarelto 15 mg daily.   CONSULTANTS DURING THE HOSPITAL COURSE: Antonieta Ibaimothy J. Gollan, MD, from cardiology.   PERTINENT STUDIES DONE DURING THE HOSPITAL COURSE: A chest x-ray done on admission showing no acute findings. A CT scan of the chest done with contrast showing no evidence of pulmonary embolism. A 2-dimensional echocardiogram done showing ejection fraction of 25%-30%, severely decreased global LV systolic function, severe anterior septal and apical wall hypokinesis. Images suggestive of apical thrombus though not well imaged. Mild to moderate mitral valve regurgitation. A cardiac catheterization done on December 21 showing severely subtotal occluded proximal LAD, otherwise left circumflex RCA and ramus are patent with mild diffuse disease.   HOSPITAL COURSE: This is a 64 year old male with medical problems as mentioned above,  presented to the hospital with dizziness, palpitations, and chest pain and noted to be severely tachycardic.   Problem #1.  Ventricular tachycardia. Initially, when the patient presented to the hospital, they thought the patient had possible SVT, and was given a few doses of adenosine in the ER, although his rhythm did not break.  The patient was started on an amiodarone bolus and eventually placed on an amiodarone drip and then his tachycardia improved. Cardiology was consulted. They further evaluated his rhythm strips and his EKG and they thought that this was more ventricular tachycardia rather than SVT. His pacemaker was, therefore, interrogated and adjusted prior to discharge. After being on amiodarone drip and oral amiodarone, he has had no further episodes of ventricular tachycardia and has been asymptomatic. At this point, therefore, he is being discharged on the oral amiodarone with close followup with cardiology and electrophysiology as an outpatient. He will continue Coreg and amiodarone as mentioned.  Problem #2.  Non-ST elevation MI.  The patient's troponins went up as high as 16. It was unclear whether this was an acute ischemia as patient had no acute chest pain. The patient underwent  cardiac catheterization which showed a chronically occluded proximal LAD, but his other vessels were quite patent with mild diffuse disease. His troponin elevation was probably related to his nonsustained ventricular tachycardia not secondary to an acute MI. Although he does have significant coronary disease; therefore, his Lipitor dose was increased. For now, he will continue aspirin, Coreg, and statin, and ACE inhibitor as mentioned and close follow up with cardiology.  Problem #3.  Apical mural thrombus. This was seen on echocardiogram. The patient was started  on oral Xarelto, is being discharged on that.  He likely should have a repeat echocardiogram done in the next 2-3 months to see if the thrombus has  resolved.  Problem #4.  History of CHF. This is a history of chronic systolic CHF.  Clinically, while in the hospital, he had no evidence of congestive heart failure. He will continue his Lasix Coreg, and ACE inhibitor as stated.  Problem #5.  Hyperlipidemia, as mentioned the patient's Lipitor dose was advanced from 10 mg to 40 mg.   The patient is a full code. He is being discharged home.   TIME SPENT: 40 minutes.    ____________________________ Billy [name]. Cherlynn Kaiser, MD vjs:LT D: 11/11/2014 17:02:23 ET T: 11/11/2014 20:31:31 ET JOB#: [id number]  cc: Billy [name]. Cherlynn Kaiser, MD, <Dictator> Antonieta Iba, MD Duncan Dull, MD Houston Siren MD ELECTRONICALLY SIGNED 11/20/2014 10:46

## 2015-03-14 NOTE — H&P (Signed)
PATIENT NAME:  Billy [name], Billy [name] MR#:  [id number] DATE OF BIRTH:  March 02, 1951  DATE OF ADMISSION:  11/09/2014  REFERRING PHYSICIAN: Gladstone Pih, MD   PRIMARY CARE PHYSICIAN: Duncan Dull, MD  ADMISSION DIAGNOSIS: Sustained supraventricular tachycardia.   HISTORY OF PRESENT ILLNESS: This is a 64 year old Caucasian male who presents to the Emergency Department via EMS for heart rate in the 180s. The patient received 2 doses of adenosine in the field and 1 dose of adenosine upon arrival to the Emergency Department. He received 4 liters of fluid resuscitation without conversion of his rate and then an amiodarone bolus with subsequent drip. Cardioversion was discussed with cardiology who discouraged electrocardioversion due to hypotension potentially worsenign with sedation. His systolic blood pressure got to the high 70s, at which time Levophed was started. A dose of digoxin was given and the patient converted to normal sinus rhythm approximately 1 hour later. CTA was performed in the Emergency Department to rule out pulmonary embolism, which was negative. The patient is now comfortable but needs observation, which prompted the Emergency Department to call for admission.  The patient's symptoms began as he was walking up 4 steps into his home. He began to feel dizzy and sat down, but at that time his right upper extremity began to hurt. He experienced some test chest tightness, but denies any explicit pain. He admits to becoming clammy and diaphoretic. This began approximately 3-1/2 hours prior to conversion.   REVIEW OF SYSTEMS: CONSTITUTIONAL: The patient denies fever or fatigue.  EYES: Denies blurred vision or inflammation.  EARS, NOSE, AND THROAT: Denies tinnitus or difficulty swallowing.  RESPIRATORY: Denies cough or shortness of breath.  CARDIOVASCULAR: Admits to chest tightness, but denies orthopnea, paroxysmal nocturnal dyspnea, or palpitations.  GASTROINTESTINAL: Denies nausea, vomiting, or  diarrhea.  GENITOURINARY: Denies dysuria, increased frequency, or hesitancy of urination.  ENDOCRINE: Denies polyuria or polydipsia.  HEMATOLOGIC AND LYMPHATIC: Denies easy bruising or bleeding.  INTEGUMENTARY: Denies rashes or lesions.  MUSCULOSKELETAL: Denies myalgias or arthralgias.  NEUROLOGIC: Denies numbness of his extremities or dysarthria.  PSYCHIATRIC: Denies depression or suicidal ideation.   PAST MEDICAL HISTORY: Hypertension, hyperlipidemia, systolic heart failure due to ischemic cardiomyopathy, anemia, thrombocytopenia, obesity.   PAST SURGICAL HISTORY: Cholecystectomy, appendectomy, knee repair, and AICD and pacemaker placement.   SOCIAL HISTORY: The patient stopped smoking approximately 24 years ago. He occasionally drinks alcohol and he denies any illicit drug use.   FAMILY HISTORY: Significant for hypertension, as well as coronary artery disease in both of his parents as well as his brother.   MEDICATIONS:  1.  Aspirin 81 mg 1 tablet p.o. daily.  2.  Enalapril 10 mg 1 tab p.o. b.i.d.  3.  Fish oil 1000 mg 1 capsule p.o. 3 times per week.  4.  Furosemide 40 mg 1 tab p.o. daily.  5.  Carvedilol 25 mg 1 tab p.o. b.i.d.  6.  Atorvastatin 40 mg 1 tab p.o. at bedtime.   ALLERGIES: No known drug allergies.   PERTINENT LABORATORY DATA AND RADIOGRAPHIC FINDINGS: Serum glucose is 116, BUN is 18, creatinine 1.49, serum sodium 138, potassium 4.1, chloride 103, bicarbonate is 26, calcium is 8.3. Troponin is negative. Thyroid stimulating hormone is 4.63. White blood cell count is 7.1, hemoglobin 13.1, hematocrit 40.5, platelet count 210,000. Chest x-ray shows right IJ catheter tip in the right atrium. There is no pneumothorax. Chest x-ray prior to IJ placement shows an underpenetrated radiograph without acute or definite lung findings.   PHYSICAL EXAMINATION:  VITAL SIGNS:  Temperature is 98.7, pulse 67 at this time, respirations 21, blood pressure is 111/62, pulse oximetry is 98% on  room air.  GENERAL: The patient is alert and oriented x 3, in no apparent distress.  HEENT: Normocephalic, atraumatic. Pupils equal, round, and reactive to light and accommodation. Extraocular movements are intact. Mucous membranes are moist.  NECK: Trachea is midline. No adenopathy.  CHEST: Symmetric and atraumatic.  CARDIOVASCULAR: Regular rate and rhythm. Normal S1, S2. No rubs, clicks, or murmurs appreciated.  LUNGS: Clear to auscultation bilaterally. Normal effort and excursion.  ABDOMEN: Positive bowel sounds. Soft, nontender, nondistended. No hepatosplenomegaly.  GENITOURINARY: Deferred.  MUSCULOSKELETAL: The patient moves all 4 extremities equally. There is 5/5 strength in upper and lower extremities bilaterally.  SKIN: No rashes or lesions.  EXTREMITIES: No clubbing or cyanosis. The patient does have 1+ pitting edema of his lower extremities, which he states is normal for him.  NEUROLOGIC: Cranial nerves II through XII are grossly intact.  PSYCHIATRIC: Mood is normal. Affect is congruent.   ASSESSMENT AND PLAN: This is a 64 year old male admitted for sustained supraventricular tachycardia.  1.  Supraventricular tachycardia is currently resolved. The patient is on an amiodarone drip and in normal sinus rhythm. We will continue to monitor his heart rhythm and a cardiology consult has been placed.  2.  Hypertension. We will continue carvedilol.  3.  Hypothyroidism. I have started the patient on a dose of Synthroid. We will also check a free T4.  4.  Congestive heart failure. The patient has ischemic cardiomyopathy and systolic heart failure. He is class II heart failure by New York Heart Association criteria. We will continue his Lasix per his home regimen.  5.  Acute kidney injury. This is likely prerenal due to low flow state. Hopefully, he is thoroughly fluid resuscitated at this time. We will reassess his kidney function with laboratory evaluation in the morning.  6.  Hyperlipidemia.  Continue statin therapy.  7.  Obesity. The patient's body mass index is 38.7. I have encouraged a heart-healthy diet and exercise.  8.  Deep vein thrombosis prophylaxis. The patient is currently on therapeutic heparin.  9.  Gastrointestinal prophylaxis. None.   CODE STATUS: The patient is a FULL CODE.   TIME SPENT ON ADMISSION ORDERS AND PATIENT CARE: Approximately 35 minutes.    ____________________________ Kelton PillarMichael S. Sheryle Hailiamond, MD msd:ts D: 11/09/2014 02:33:07 ET T: 11/09/2014 02:59:12 ET JOB#: [id number]  cc: Kelton PillarMichael S. Sheryle Hailiamond, MD, <Dictator> Kelton PillarMICHAEL S Halford Goetzke MD ELECTRONICALLY SIGNED 11/10/2014 0:29

## 2015-03-18 ENCOUNTER — Encounter: Payer: Self-pay | Admitting: Internal Medicine

## 2015-03-18 NOTE — Consult Note (Signed)
General Aspect Wide complex tachycardia   Present Illness The patient has a history of ischemic cardiomyopathy and ventricular tachycardia.  He was recently hospitalized for this.  He has a history of ICD/CRT.  At the last hospitalization he was treated with IV amiodarone and discharged on PO amiodarone.  He did rule in for a myocardial infarction at the last visit.  Cath on that admission showed EF 25% with chronic subtotal LAD stenosis.  He was managed medically.  Of note there was an apical thrombus suspected on echo and he has been treated with Xarelto.  He returned today because his HR again became elevated.  This was similar to his previous presentation with some right wrist pain which seems to be his angina.  However, the discomfort was mild and the heart rate was in the 140s instead of 180s.  In the ED he had a wide complex and was treated with amiodarone bolus and drip.  He eventually converted to NSR.  He otherwise has had no cardiovascular symptoms since discharge.  He denies chest, neck or arm pain.  He has had no new SOB, PND or orthoponea.  He has had no weight gain.  He did have a bout of gout after going home.    PMH:  CAD (as above), ischemic cardiomyopathy, ventricular tachycardia, gout, ICD (2008 Dr. Caryl Comes, upgrade to [facility] ICD/CRT, HTN, neprholithiasis, MI 1991  PSH:  Cholecystectomy, knee surgery  FH:  Brother with MI age 14s, mother CAD 44s  SOCIAL:  Quit tobacco in 68   Physical Exam:  GEN well developed, no acute distress   HEENT PERRL, moist oral mucosa   NECK supple  No masses  thyroid tender   RESP normal resp effort  no use of accessory muscles   CARD Regular rate and rhythm  Normal, S1, S2   ABD denies tenderness  denies Flank Tenderness  normal BS   LYMPH negative neck   EXTR negative cyanosis/clubbing, negative edema   SKIN normal to palpation   NEURO cranial nerves intact, motor/sensory function intact   PSYCH alert, A+O to time, place,  person   Review of Systems:  Review of Systems: All other systems were reviewed and found to be negative   Home Medications: Medication Instructions Status  amiodarone 200 mg oral tablet 1 tab(s) orally 2 times a day Active  rivaroxaban 15 mg oral tablet 1 tab(s) orally once a day Active  atorvastatin 40 mg oral tablet 1 tab(s) orally once a day Active  enalapril 10 mg oral tablet 1 tab(s) orally 2 times a day Active  furosemide 40 mg oral tablet 1 tab(s) orally once a day Active  aspirin 81 mg oral delayed release tablet 1 tab(s) orally once a day Active  Fish Oil 1000 mg oral capsule 1 tab(s) orally 3 times a week Active  magnesium oxide 400 mg oral tablet 1 tab(s) orally 3 to 4 times a week Active  potassium chloride 10 mEq oral tablet, extended release  orally once a day Active  Coreg 25 mg oral tablet 1 tab(s) orally 2 times a day Active   Lab Results: Hepatic:  02-Jan-16 11:25   Bilirubin, Total 0.5  Alkaline Phosphatase 104 (46-116 NOTE: New Reference Range 06/10/14)  SGPT (ALT) 37 (14-63 NOTE: New Reference Range 06/10/14)  SGOT (AST) 32  Total Protein, Serum 7.1  Albumin, Serum  3.3  Routine Chem:  02-Jan-16 11:25   Magnesium, Serum 2.0 (1.8-2.4 THERAPEUTIC RANGE: 4-7 mg/dL TOXIC: > 10  mg/dL  -----------------------)  Result Comment APTT - RESULTS VERIFIED BY REPEAT TESTING.  - NOTIFIED OF CRITICAL VALUE  - CALLED TO ELIZABETH BAER @ 1610  - ON 11/22/2014 CAF  - READ-BACK PROCESS PERFORMED.  Result(s) reported on 22 Nov 2014 at 12:19PM.  Glucose, Serum  105  BUN  19  Creatinine (comp)  1.62  Sodium, Serum  135  Potassium, Serum 4.1  Chloride, Serum 101  CO2, Serum 27  Calcium (Total), Serum  8.3  Osmolality (calc) 273  eGFR (African American)  56  eGFR (Non-African American)  46 (eGFR values <67m/min/1.73 m2 may be an indication of chronic kidney disease (CKD). Calculated eGFR, using the MRDR Study equation, is useful in  patients with stable renal  function. The eGFR calculation will not be reliable in acutely ill patients when serum creatinine is changing rapidly. It is not useful in patients on dialysis. The eGFR calculation may not be applicable to patients at the low and high extremes of body sizes, pregnant women, and vegetarians.)  Anion Gap 7  B-Type Natriuretic Peptide ([facility]  802 (Result(s) reported on 22 Nov 2014 at 11:51AM.)  Cardiac:  02-Jan-16 11:25   Troponin I < 0.02 (0.00-0.05 0.05 ng/mL or less: NEGATIVE  Repeat testing in 3-6 hrs  if clinically indicated. >0.05 ng/mL: POTENTIAL  MYOCARDIAL INJURY. Repeat  testing in 3-6 hrs if  clinically indicated. NOTE: An increase or decrease  of 30% or more on serial  testing suggests a  clinically important change)  CK, Total 61  CPK-MB, Serum 1.0 (Result(s) reported on 22 Nov 2014 at 11:51AM.)  Routine Coag:  02-Jan-16 11:25   Prothrombin  28.6  INR 2.8 (INR reference interval applies to patients on anticoagulant therapy. A single INR therapeutic range for coumarins is not optimal for all indications; however, the suggested range for most indications is 2.0 - 3.0. Exceptions to the INR Reference Range may include: Prosthetic heart valves, acute myocardial infarction, prevention of myocardial infarction, and combinations of aspirin and anticoagulant. The need for a higher or lower target INR must be assessed individually. Reference: The Pharmacology and Management of the Vitamin K  antagonists: the seventh ACCP Conference on Antithrombotic and Thrombolytic Therapy. CRUEAV.4098Sept:126 (3suppl): [id number] A HCT value >55% may artifactually increase the PT.  In one study,  the increase was an average of 25%. Reference:  "Effect on Routine and Special Coagulation Testing Values of Citrate Anticoagulant Adjustment in Patients with High HCT Values." American Journal of Clinical Pathology 2006;126:400-405.)  Activated PTT (APTT)  51.8 (A HCT value >55% may  artifactually increase the APTT. In one study, the increase was an average of 19%. Reference: "Effect on Routine and Special Coagulation Testing Values of Citrate Anticoagulant Adjustment in Patients with High HCT Values." American Journal of Clinical Pathology 2006;126:400-405.)  Routine Hem:  02-Jan-16 11:25   WBC (CBC) 7.1  RBC (CBC)  4.10  Hemoglobin (CBC)  12.4  Hematocrit (CBC)  37.0  Platelet Count (CBC) 233 (Result(s) reported on 22 Nov 2014 at 11:47AM.)  MCV 90  MCH 30.3  MCHC 33.6  RDW 13.3   EKG:  EKG Interp. by me   Interpretation Ventricular tachycardia rate 143   Radiology Results: XRay:    02-Jan-16 11:45, Chest Portable Single View  Chest Portable Single View   REASON FOR EXAM:    Chest Pain  COMMENTS:       PROCEDURE: DXR - DXR PORTABLE CHEST SINGLE VIEW  - Nov 22 2014 11:45AM  CLINICAL DATA:  Tachycardia.  Myocardial infarction.    EXAM:  PORTABLE CHEST - 1 VIEW    COMPARISON:  11/09/2014    FINDINGS:  Pacer/AICD device. Numerous leads and wires project over the chest.  Midline trachea. Borderline cardiomegaly. Removal of right IJ line.  No pleural effusion or pneumothorax. Low lung volumes with resultant  pulmonary interstitial prominence. No congestive failure.     IMPRESSION:  Borderline cardiomegaly, without acute disease.      Electronically Signed    By: Abigail Miyamoto M.D.    On: 11/22/2014 12:17         Verified By: Areta Haber, M.D.,    NKDA: None  Vital Signs/Nurse's Notes: **Vital Signs.:   02-Jan-16 14:30  Vital Signs Type Routine  Pulse Pulse 69  Pulse source if not from Vital Sign Device per cardiac monitor  Respirations Respirations 20  Systolic BP Systolic BP 122  Diastolic BP (mmHg) Diastolic BP (mmHg) 70  Mean BP 81  Pulse Ox % Pulse Ox % 97  Pulse Ox Activity Level  At rest  Oxygen Delivery Room Air/ 21 %    Impression Ventricular tachycardia:  This seems to have responded to IV amiodarone.  He will  continue on this for 24 hours.  Likely he will be sent home on a higher dose of PO amiodarone for a while.  No further in patient work up will likely be needed given the extensive work up at the last visit.  CAD:  I suspect he has had some demand ischedia with his symptoms.  Continue current therapy.  Ischemic cardiomyopathy: BNP is elevated.  However, he seems to be euvolemic.  Continue current therapy.   Electronic Signatures: Minus Breeding (MD)  (Signed 02-Jan-16 16:02)  Authored: General Aspect/Present Illness, History and Physical Exam, Review of System, Home Medications, Labs, EKG , Radiology, Allergies, Vital Signs/Nurse's Notes, Impression/Plan   Last Updated: 02-Jan-16 16:02 by Minus Breeding (MD)

## 2015-03-18 NOTE — H&P (Signed)
PATIENT NAME:  Billy [name], [name] MR#:  [id number] DATE OF BIRTH:  1950-12-08  DATE OF ADMISSION:  11/22/2014  PRIMARY CARE PHYSICIAN:  Duncan Dulleresa Tullo, MD  CARDIOLOGIST:  Julien Nordmannimothy Gollan, MD   CHIEF COMPLAINT: Fast heart rate.   HISTORY OF PRESENT ILLNESS: This is a 64 year old man who was recently in the hospital for similar things and was recently discharged on 11/11/2014, had a wide complex tachycardia and ventricular tachycardia at that time, non-STEMI.  At this time presents with elevated heart rate starting this a.m. at 11:00 a.m. He feels okay. He does have some right wrist pain, which he did have the last time he had this.  He does have some soreness in his chest, 1/10 in intensity. No shortness of breath. No diaphoresis. No nausea or vomiting. In the ER, he was found to have a wide complex tachycardia 140 beats per minute.  ER physician spoke with Dr. covering for Dr. Mariah MillingGollan and amiodarone drip was recommended.  Hospitalist services were contacted for further evaluation.   PAST MEDICAL HISTORY: Coronary artery disease, cardiomyopathy, hyperlipidemia, chronic kidney disease Stage III.   PAST SURGICAL HISTORY: Defibrillator placement and gallbladder surgery.   ALLERGIES: No known drug allergies.   MEDICATIONS: Include amiodarone 200 mg twice a day, aspirin 81 mg daily, atorvastatin 40 mg daily, Coreg 25 mg twice a day, enalapril 10 mg twice a day, fish oil 1000 mg 3 times a week, furosemide 40 mg daily, magnesium oxide 400 mg 3 to 4 times a week, potassium chloride 10 mEq daily, magnesium 400 mg 3 to 4 times a week, Xarelto 15 mg daily.   SOCIAL HISTORY: Quit smoking in 1991, 2 packs per day since age 89 prior to that. Occasional alcohol. No drug use. 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   FAMILY HISTORY: Father with CABG, mother with CABG, brother with heart disease.  REVIEW OF SYSTEMS:  GENERAL: No fever, chills, or sweats. No weight loss. No weight gain.  EYES: Does wear glasses.  EARS, NOSE,  MOUTH AND THROAT: No hearing loss. No sore throat. No difficulty swallowing.  CARDIOVASCULAR: Positive for chest pressure.  Positive for palpitations.  RESPIRATORY: No shortness of breath. No cough. No sputum. No hemoptysis.  GASTROINTESTINAL: No nausea. No vomiting. No abdominal pain. No diarrhea. No constipation. No bright red blood per rectum. No melena.  GENITOURINARY: No burning on urination or hematuria.  MUSCULOSKELETAL: Positive for right wrist pain also left knee pain.  INTEGUMENT: No rashes or eruptions.  NEUROLOGIC: No fainting or blackouts.  PSYCHIATRIC: No anxiety or depression.  ENDOCRINE: No thyroid problems.  HEMATOLOGIC AND LYMPHATIC: No anemia. No easy bruising or bleeding.   PHYSICAL EXAMINATION:  VITAL SIGNS: Temperature 97.7, pulse 140, respirations 16, blood pressure 193/65, pulse oximetry 97% on room air.  GENERAL: No respiratory distress.  EYES: Conjunctivae and lids normal. Pupils equal, round, and reactive to light.  Extraocular muscles intact. No nystagmus.  EARS, NOSE, MOUTH AND THROAT: Tympanic membranes, no erythema. Nasal mucosa: No erythema.  Throat:  No erythema. No exudate seen. Lips and gums: No lesions.  NECK: No JVD. No bruits. No lymphadenopathy. No thyromegaly. No thyroid nodules palpated.  RESPIRATORY:  Lungs, clear to auscultation. No use of accessory muscles to breathe. No rhonchi, rales, or wheeze heard.  CARDIOVASCULAR: S1 and S2, tachycardic. No gallops, or rubs heard, 2/6 systolic ejection murmur. Carotid upstroke 2+ bilaterally.  No bruits. Dorsalis pedis pulses 1+ bilaterally. Trace edema of the lower extremity.  ABDOMEN: Soft, nontender. No organosplenomegaly. Normoactive bowel  sounds. No masses felt.  LYMPHATIC: No lymph nodes in the neck.  MUSCULOSKELETAL: No clubbing, edema, cyanosis.  SKIN: No rashes or ulcers seen.  NEUROLOGIC: Cranial nerves II through XII grossly intact. Deep tendon reflexes 2+ bilateral lower extremities.  PSYCHIATRIC:  The patient is oriented to person, place, and time.   LABORATORY AND RADIOLOGICAL DATA:  Chest x-ray, borderline cardiomegaly, without acute disease. Troponin negative. INR 2.8, PT 28.6, PTT 51.8, white blood cell count 7.1, hemoglobin and hematocrit 12.4 and 37.0, platelet count of 233, glucose 105, BUN 19, creatinine 1.62, sodium 135, potassium 4.1, chloride 101, CO2 of 27, calcium 8.3. Liver function tests normal range.  BNP elevated at 802.  EKG: Wide complex tachycardia likely ventricular tachycardia 150 beats per minute.   ASSESSMENT AND PLAN:  1.  Wide complex tachycardia likely ventricular tachycardia. We will admit to the Critical Care Unit. The patient is currently on amiodarone drip. The ER physician, Dr. Mayford Knife spoke with Dr. covering for Dr. Mariah Milling, who will see the patient in consultation.  I will have nursing staff call [employer] to interrogate the defibrillator.  I will check a magnesium and replace if this is low. Will put on telemetry monitor. Continue aspirin and Xarelto at this point.  Blood pressure is too low for Coreg or other blood pressure medications at this point.  2.  Hypotension, hold antihypertensive medications, give very gentle IV fluids watching closely for congestive heart failure since the patient has a cardiomyopathy and a low ejection fraction.  3.  Hyperlipidemia, continue statin.  4.  Chronic kidney disease Stage III. Creatinine slightly elevated than what it was last time.  Watch with gentle fluids and hold the Lasix for right now.  5.  Listed as apical mural thrombus the last time here, but it was not imaged well on the echocardiogram. The patient is on blood thinner.  6.  History of coronary artery disease; last time had a cardiac catheterization that showed proximal LAD 95% stenosis, mid RCA 50% stenosis, no intervention was done at that time.  Will let cardiology re-evaluate on this.  Time Spent ON admission: 55 minutes.  The patient will be admitted  to the Critical Care Unit. The patient is critically ill.     ____________________________ Herschell Dimes. Renae Gloss, MD rjw:DT D: 11/22/2014 13:13:28 ET T: 11/22/2014 13:50:21 ET JOB#: [id number]  cc: Herschell Dimes. Renae Gloss, MD, <Dictator> Duncan Dull, MD   Salley Scarlet MD ELECTRONICALLY SIGNED 11/30/2014 15:09

## 2015-03-19 ENCOUNTER — Ambulatory Visit (INDEPENDENT_AMBULATORY_CARE_PROVIDER_SITE_OTHER): Payer: BLUE CROSS/BLUE SHIELD | Admitting: Cardiovascular Disease

## 2015-03-19 ENCOUNTER — Encounter: Payer: Self-pay | Admitting: Cardiovascular Disease

## 2015-03-19 VITALS — BP 110/62 | HR 56 | Ht 70.0 in | Wt 283.8 lb

## 2015-03-19 DIAGNOSIS — I472 Ventricular tachycardia, unspecified: Secondary | ICD-10-CM

## 2015-03-19 DIAGNOSIS — E669 Obesity, unspecified: Secondary | ICD-10-CM

## 2015-03-19 DIAGNOSIS — R609 Edema, unspecified: Secondary | ICD-10-CM

## 2015-03-19 DIAGNOSIS — I5022 Chronic systolic (congestive) heart failure: Secondary | ICD-10-CM | POA: Diagnosis not present

## 2015-03-19 DIAGNOSIS — E785 Hyperlipidemia, unspecified: Secondary | ICD-10-CM

## 2015-03-19 DIAGNOSIS — I255 Ischemic cardiomyopathy: Secondary | ICD-10-CM

## 2015-03-19 DIAGNOSIS — R0602 Shortness of breath: Secondary | ICD-10-CM

## 2015-03-19 MED ORDER — FUROSEMIDE 40 MG PO TABS: 40.0000 mg | ORAL_TABLET | Freq: Two times a day (BID) | ORAL | Status: DC | PRN

## 2015-03-19 NOTE — Assessment & Plan Note (Signed)
We have recommended compression hose. Suspect some of his edema is from venous insufficiency. In the setting of dehydration, creatinine greater than 2, he continued to have leg edema

## 2015-03-19 NOTE — Assessment & Plan Note (Signed)
I suspect his goal weight is approximately 272-275 pounds. Lasix recently held given worsening renal function, creatinine greater than 2. He will restart Lasix 40 mg daily, extra Lasix after lunch for elevated weight, worsening leg edema. Very mild cough on today's visit though unable to exclude allergies.

## 2015-03-19 NOTE — Patient Instructions (Addendum)
You are doing well. Weight is up slightly Goal weight 272  Continue to hold the aldoctone Restart lasix 40 mg daily For the next few days or as needed for swelling (legs), Ok to take extra lasix after lunch  Please call us if you have new issues that need to be addressed before your next appt.  Your physician wants you to follow-up in: 6 months.  You will receive a reminder letter in the mail two months in advance. If you don't receive a letter, please call our office to schedule the follow-up appointment.

## 2015-03-19 NOTE — Assessment & Plan Note (Signed)
Cholesterol is at goal on the current lipid regimen. No changes to the medications were made.  

## 2015-03-19 NOTE — Assessment & Plan Note (Signed)
Currently with no symptoms of angina. No further workup at this time. We will restart his Lasix Aldactone held secondary to hyperkalemia

## 2015-03-19 NOTE — Assessment & Plan Note (Signed)
We have encouraged continued exercise, careful diet management in an effort to lose weight. 

## 2015-03-19 NOTE — Assessment & Plan Note (Signed)
Rare episodes of VT. Recommended extra doses of amiodarone, half doses of carvedilol as needed for breakthrough sustained VT.

## 2015-03-19 NOTE — Progress Notes (Signed)
Patient ID: Billy [name], male    DOB: January 13, 1951, 64 y.o.   MRN: [id number]  HPI Comments: Mr. Billy [name] is a pleasant 64 -year-old male with obesity, coronary artery disease, previous MI in 521990s with  ICD, previous episodes of VT February 2014 terminated with pacing presenting to the emergency room November 09 2014 with heart rate 180, sustained VT, treated with amiodarone infusion, troponin 15 with cardiac catheterization at that time showing stable severe coronary disease, anterior wall MI/scar, recurrent VT  again treated with amiodarone IV infusion. He presents for follow-up for his coronary artery disease, VT, chronic systolic CHF  In follow-up today, he reports that he feels well. He has had a 15 pound weight gain since Lasix was held. When creatinine was 2 concerning for dehydration, weight was 262 pounds at home. He feels that he is up 15 pounds from there now. When dehydrated, he continued to have lower extremity edema though this was improved from today. He has had several episodes of sustained VT lasting 15-20 minutes. Last episode approximately one month ago. Aldactone was held for hyperkalemia, 5.4, up from low 4 range. When Aldactone was started, he had held his potassium supplement. It would seem the Lasix 40 mg twice a day alternating with 40 mg daily cause a prerenal state with creatinine greater than his usual baseline, greater than 2. He denies any significant shortness of breath though does have a mild cough notable in the morning. Wife thinks it could be allergies.  EKG on today's visit shows paced rhythm with rate 56 bpm  Other past medical history His first episode of VT lasted 3 and half hours before converting back to normal sinus rhythm on amiodarone. ER had given adenosine with no improvement of his symptoms. He was hypotensive while in VT but mentating well. On some 20th, anti-tachycardic pacing was delivered twice though did not terminate the VT. VT was then to slow  for detection and shock was not delivered Parameters were changed while in the hospital and VT detection was lowered to 165 bpm, intervals changed  ICD was interrogated today while in the office by Gunnar FusiPaula and VT was not detected on download. EKG shows VT with rate 143 bpm in the emergency room 11/22/2014. [facility] In the hospital December 20 he did have a chest CT scan with no PE Echocardiogram showed ejection fraction 25-30%, severe anterior and septal, apical hypokinesis. Images suggestive of apical thrombus though not well imaged. He was started on anticoagulation, mild to moderate MR   Cardiac catheterization 11/10/2014 showing severely subtotal occluded proximal LAD, left circumflex, RCA, and ramus patent with mild disease   lab work shows total cholesterol 112, LDL 56      No Known Allergies  Outpatient Encounter Prescriptions as of 03/19/2015  Medication Sig  . amiodarone (PACERONE) 200 MG tablet Take 1 tablet (200 mg total) by mouth daily.  Marland Kitchen. aspirin 81 MG tablet Take 81 mg by mouth daily.    Marland Kitchen. atorvastatin (LIPITOR) 40 MG tablet Take 40 mg (one tablet) three times a week  . carvedilol (COREG) 25 MG tablet Take 25 mg (one tablet) in the morning and 12.5 mg (1/2 tablet) at night  . enalapril (VASOTEC) 20 MG tablet TAKE ONE-HALF TABLET BY MOUTH TWICE DAILY  . magnesium oxide (MAG-OX) 400 MG tablet Take 400 mg by mouth every other day.  . multivitamin (THERAGRAN) per tablet Take 1 tablet by mouth daily.    .Marland Kitchen  nitroGLYCERIN (NITROSTAT) 0.4 MG SL tablet Place 1 tablet (0.4 mg total) under the tongue every 5 (five) minutes as needed for chest pain.  . Omega-3 Fatty Acids (FISH OIL) 1000 MG CAPS Take 1,000 mg by mouth every other day.   . [DISCONTINUED] furosemide (LASIX) 40 MG tablet Take 40 mg twice daily every other day alternating with 40 mg once daily (Patient not taking: Reported on 03/19/2015)  . [DISCONTINUED] spironolactone (ALDACTONE) 25 MG  tablet Take 1 tablet (25 mg total) by mouth daily. (Patient not taking: Reported on 03/19/2015)    Past Medical History  Diagnosis Date  . Ischemic cardiomyopathy     s/p ICD implant 2008 with subsequent upgrade to CRTD 2014  . LBBB (left bundle branch block)   . Obesity   . Hypertension   . Ventricular tachycardia     syncopal  treated via ICD  . Chicken pox   . Heart disease   . Hyperlipidemia   . Kidney stone   . Gout   . Automatic implantable cardioverter-defibrillator in situ   . Myocardial infarction 1991  . Arthritis     Past Surgical History  Procedure Laterality Date  . Implantable cardioverter defibrillator implant  2008    as part of MADIT-CRT trial - single chamber device  . Cholecystectomy    . Appendectomy  1955  . Knee surgery  2005  . Bi-ventricular implantable cardioverter defibrillator upgrade  10-16-2013    CRTD upgrade by Dr Graciela Husbands  . Icd generator change  10/16/2013    Dr Graciela Husbands  . Biv icd genertaor change out N/A 10/16/2013    Procedure: BIV ICD GENERTAOR CHANGE OUT;  Surgeon: Duke Salvia, MD;  Location: [facility] CATH LAB;  Service: Cardiovascular;  Laterality: N/A;  . Cardiac catheterization  11/10/2014    [facility]    Social History  reports that he quit smoking about 24 years ago. His smoking use included Cigarettes. He has a 30 pack-year smoking history. He has never used smokeless tobacco. He reports that he drinks about 1.5 oz of alcohol per week. He reports that he does not use illicit drugs.  Family History family history includes Arthritis in his mother; Heart disease in his father; Heart disease (age of onset: 64) in his mother; Mental illness in his father.   Review of Systems  Constitutional: Positive for unexpected weight change.  Respiratory: Negative.   Cardiovascular: Positive for leg swelling.  Gastrointestinal: Negative.   Musculoskeletal: Negative.   Skin: Negative.   Neurological: Negative.   Hematological: Negative.    Psychiatric/Behavioral: Negative.   All other systems reviewed and are negative.   BP 110/62 mmHg  Pulse 56  Ht  (1.778 m)  Wt 283 lb 12 oz (128.708 kg)  BMI 40.71 kg/m2  Physical Exam  Constitutional: He is oriented to person, place, and time. He appears well-developed and well-nourished.  HENT:  Head: Normocephalic.  Nose: Nose normal.  Mouth/Throat: Oropharynx is clear and moist.  Eyes: Conjunctivae are normal. Pupils are equal, round, and reactive to light.  Neck: Normal range of motion. Neck supple. No JVD present.  Cardiovascular: Normal rate, regular rhythm, S1 normal, S2 normal, normal heart sounds and intact distal pulses.  Exam reveals no gallop and no friction rub.   No murmur heard. 1+ pitting edema to the mid shins  Pulmonary/Chest: Effort normal and breath sounds normal. No respiratory distress. He has no wheezes. He has no rales. He exhibits no tenderness.  Abdominal: Soft. Bowel sounds are  normal. He exhibits no distension. There is no tenderness.  Musculoskeletal: Normal range of motion. He exhibits edema. He exhibits no tenderness.  Lymphadenopathy:    He has no cervical adenopathy.  Neurological: He is alert and oriented to person, place, and time. Coordination normal.  Skin: Skin is warm and dry. No rash noted. No erythema.  Psychiatric: He has a normal mood and affect. His behavior is normal. Judgment and thought content normal.      Assessment and Plan   Nursing note and vitals reviewed.

## 2015-03-22 NOTE — Discharge Summary (Signed)
PATIENT NAME:  Billy [name], Billy [name] MR#:  [id number] DATE OF BIRTH:  1951-03-27  DATE OF ADMISSION:  11/22/2014 DATE OF DISCHARGE:  11/23/2014  ADMITTING DIAGNOSES:  1.  Wide-complex tachycardia, likely ventricular tachycardia.  2.  Hypertension.  3.  Hyperlipidemia.  4.  Chronic kidney disease, stage 3.  5.  History of apical mural thrombus.  6.  History of coronary artery disease.   DISCHARGE DIAGNOSES:  1.  Ventricular tachycardia.  2.  Demand ischemia.  3.  Chronic kidney disease.   CONSULTATION: Cardiology, Antonieta Iba, MD  PROCEDURES: None.   CODE STATUS: FULL CODE.   BRIEF HISTORY AND PHYSICAL: The patient is a 64 year old male recently in the hospital for palpitations, coming to the ED with fast heartbeat. The patient's elevated heart rate started at 11:00 a.m. on the day of admission. He was sore in chest with 1/10 intensity, with no shortness of breath. He was found to be in wide complex tachycardia at rate of 140 in the ED. ED physician talked to his cardiology group and discussed with on-call physician, Dr. Mariah Milling, who had recommended amiodarone drip. The patient was admitted to the hospital with complex tachycardia, likely ventricular tachycardia. The patient was placed in the Critical Care Unit and started on amiodarone drip. Cardiology consult was placed and [facility] was called to interrogate the defibrillator. The patient's aspirin and Xarelto were continued. As his blood pressure was too low, Coreg and other blood pressure medications were held at the time of admission. Please see the history and physical for details. The patient was given IV fluids for hypotension and the plan was to continuously monitor him for symptoms and signs of fluid overload.   HOSPITAL COURSE:  1.  The patient was monitored closely and he was placed on amiodarone drip. Ventricular tachycardia resolved. The patient was evaluated by Dr. Mariah Milling, cardiologist. Dr. Mariah Milling  recommended to continue amiodarone drip for 24 hours and discharge him with p.o. amiodarone if the patient is doing better. Subsequently, his ventricular tachycardia completely resolved. Amiodarone drip was continued for 24 hours and then discontinued. The patient was switched to high dose of p.o. amiodarone. The patient was feeling much better by the next day.  2.  Coronary artery disease and some demand ischemia. Recommended to continue current therapy.  3.  Ischemic cardiomyopathy. BNP was elevated, but the patient seemed to be euvolemic and they did not make any changes in diuretics.   Overall condition being stable, decision was made to discharge the patient home by the next day, which was January 3. The patient denied any chest pain, shortness of breath pain, or palpitations. He was feeling better with no symptoms at the time of discharge.   PHYSICAL EXAMINATION:  VITAL SIGNS: Temperature 97.4, pulse 62, respirations 18, blood pressure 119/69, pulse oximetry 97% at rest on room air on January 3.  GENERAL APPEARANCE: Not in acute distress. Moderately built and nourished.  HEENT: Normocephalic, atraumatic. Pupils are equally reacting to light and accommodation. No scleral icterus. No conjunctival injection. No sinus tenderness. No postnasal drip. Moist mucous membranes.  NECK: Supple. No JVD. No thyromegaly. Range of motion is intact.  LUNGS: Clear to auscultation bilaterally. No accessory muscle use and no anterior chest wall tenderness on palpation.  CARDIAC: S1, S2 normal. Regular rate and rhythm. No murmurs.  GASTROINTESTINAL: Soft. Bowel sounds are positive in all 4 quadrants. Nontender, nondistended. No masses felt.  NEUROLOGIC: Awake, alert, oriented x 3. Cranial nerves II through XII  are grossly intact. Motor and sensory are intact. Reflexes are 2+.  EXTREMITIES: No edema. No cyanosis. No clubbing.  SKIN: Warm to touch. Normal turgor. No rashes. No lesions.  MUSCULOSKELETAL: No joint  effusion or tenderness.  PSYCHIATRIC: Normal mood and affect.   LABORATORY AND IMAGING STUDIES: Troponin less than 0.02, next one 0.07, next one 0.24. WBC 6.2 on January 3, hemoglobin 11.2, hematocrit 34.0, platelets 193,000.   MEDICATIONS AT THE TIME OF DISCHARGE: Enalapril 10 mg p.o. 2 times a day, furosemide 40 mg p.o. once daily, aspirin 81 mg p.o. once daily, fish oil 1000 mg 1 tablet p.o. 3 times a week, magnesium oxide 400 mg p.o. 3 to 4 times a week, potassium chloride 10 mEq p.o. once daily, Coreg 25 mg p.o. 2 times a day, rivaroxaban 15 mg 1 tablet p.o. once a day, atorvastatin 40 mg p.o. once daily, amiodarone dosing 200 mg. The patient was recommended to take 200 mg 2 tablets p.o. 2 times a day for a week and then 1 tablet p.o. b.i.d., Tylenol 325 mg 2 tablets every 4 hours as needed for mild pain and temperature greater than 100.4.   DIET: Low-sodium, low fat, regular consistency.   FOLLOWUP: With primary care physician in 1 to 2 weeks and Dr. Mariah MillingGollan in 1 week, scheduled appointment on 11/25/2014.   The diagnoses and plan of care was discussed in detail with the patient. He verbalized understanding of the plan. The patient was discharged home in stable condition.   TOTAL TIME SPENT ON THE DISCHARGE AND COORDINATION OF CARE: 45 minutes.    ____________________________ Ramonita LabAruna Karron Alvizo, MD ag:ts D: 11/29/2014 19:38:17 ET T: 11/30/2014 03:23:01 ET JOB#: [id number]  cc: Ramonita LabAruna Danayah Smyre, MD, <Dictator> Antonieta Ibaimothy J. Gollan, MD Duncan Dulleresa Tullo, MD  Ramonita LabARUNA Shalissa Easterwood MD ELECTRONICALLY SIGNED 12/01/2014 22:32

## 2015-03-23 NOTE — Progress Notes (Signed)
LMOM to have patient call regarding labs and fluid pill.

## 2015-04-11 ENCOUNTER — Other Ambulatory Visit: Payer: Self-pay | Admitting: Internal Medicine

## 2015-04-11 MED ORDER — ENALAPRIL MALEATE 20 MG PO TABS: ORAL_TABLET | ORAL | Status: DC

## 2015-04-22 NOTE — Telephone Encounter (Signed)
Unread mychart message mailed to patient 

## 2015-04-28 ENCOUNTER — Ambulatory Visit (INDEPENDENT_AMBULATORY_CARE_PROVIDER_SITE_OTHER): Payer: BLUE CROSS/BLUE SHIELD | Admitting: *Deleted

## 2015-04-28 DIAGNOSIS — I255 Ischemic cardiomyopathy: Secondary | ICD-10-CM

## 2015-04-28 NOTE — Progress Notes (Signed)
Remote ICD transmission.   

## 2015-05-02 LAB — CUP PACEART REMOTE DEVICE CHECK
Battery Remaining Longevity: 96 mo
Battery Remaining Percentage: 100 %
Brady Statistic RA Percent Paced: 0 %
Date Time Interrogation Session: 20160607074100
HIGH POWER IMPEDANCE MEASURED VALUE: 42 Ohm
Lead Channel Impedance Value: 473 Ohm
Lead Channel Impedance Value: 573 Ohm
Lead Channel Pacing Threshold Amplitude: 0.5 V
Lead Channel Pacing Threshold Pulse Width: 0.5 ms
Lead Channel Setting Pacing Amplitude: 2 V
Lead Channel Setting Sensing Sensitivity: 0.6 mV
MDC IDC MSMT LEADCHNL LV PACING THRESHOLD AMPLITUDE: 1.5 V
MDC IDC MSMT LEADCHNL LV PACING THRESHOLD PULSEWIDTH: 0.5 ms
MDC IDC MSMT LEADCHNL RA IMPEDANCE VALUE: 633 Ohm
MDC IDC MSMT LEADCHNL RV PACING THRESHOLD AMPLITUDE: 0.8 V
MDC IDC MSMT LEADCHNL RV PACING THRESHOLD PULSEWIDTH: 0.5 ms
MDC IDC SET LEADCHNL LV PACING AMPLITUDE: 2.3 V
MDC IDC SET LEADCHNL LV PACING PULSEWIDTH: 0.5 ms
MDC IDC SET LEADCHNL LV SENSING SENSITIVITY: 1 mV
MDC IDC SET LEADCHNL RV PACING AMPLITUDE: 2.4 V
MDC IDC SET LEADCHNL RV PACING PULSEWIDTH: 0.5 ms
MDC IDC SET ZONE DETECTION INTERVAL: 250 ms
MDC IDC SET ZONE DETECTION INTERVAL: 316 ms
MDC IDC STAT BRADY RV PERCENT PACED: 88 %
Pulse Gen Serial Number: 101974
Zone Setting Detection Interval: 462 ms

## 2015-05-04 ENCOUNTER — Ambulatory Visit (INDEPENDENT_AMBULATORY_CARE_PROVIDER_SITE_OTHER): Payer: BLUE CROSS/BLUE SHIELD | Admitting: Internal Medicine

## 2015-05-04 ENCOUNTER — Encounter: Payer: Self-pay | Admitting: Internal Medicine

## 2015-05-04 VITALS — BP 104/64 | HR 54 | Temp 97.5°F | Resp 14 | Ht 70.0 in | Wt 273.5 lb

## 2015-05-04 DIAGNOSIS — I214 Non-ST elevation (NSTEMI) myocardial infarction: Secondary | ICD-10-CM | POA: Diagnosis not present

## 2015-05-04 DIAGNOSIS — Z23 Encounter for immunization: Secondary | ICD-10-CM | POA: Diagnosis not present

## 2015-05-04 DIAGNOSIS — Z0000 Encounter for general adult medical examination without abnormal findings: Secondary | ICD-10-CM

## 2015-05-04 DIAGNOSIS — Z125 Encounter for screening for malignant neoplasm of prostate: Secondary | ICD-10-CM

## 2015-05-04 DIAGNOSIS — D696 Thrombocytopenia, unspecified: Secondary | ICD-10-CM

## 2015-05-04 DIAGNOSIS — Z79899 Other long term (current) drug therapy: Secondary | ICD-10-CM

## 2015-05-04 DIAGNOSIS — Z1159 Encounter for screening for other viral diseases: Secondary | ICD-10-CM

## 2015-05-04 DIAGNOSIS — E669 Obesity, unspecified: Secondary | ICD-10-CM

## 2015-05-04 MED ORDER — ENALAPRIL MALEATE 20 MG PO TABS: ORAL_TABLET | ORAL | Status: DC

## 2015-05-04 NOTE — Patient Instructions (Addendum)
I am impressed by your weight loss!     Here's a healthier alternative to [employer] for your cereal:  Try [employer] as your cereal , instead of corn flakes   full of food fiber  (bottom shelf,  Big bad,  [employer] cereal aisle)   If you decide to have almond milk another try, Try Almond milk light  By [place] ,  Very low carb  Try Dr Danford Bad for dentistry [address] near the liquor store   You received the  Shingles vaccine today  Mrs [place]brake can call Olegario Messier to arrange to have the Prevnar vaccine   Health Maintenance A healthy lifestyle and preventative care can promote health and wellness.  Maintain regular health, dental, and eye exams.  Eat a healthy diet. Foods like vegetables, fruits, whole grains, low-fat dairy products, and lean protein foods contain the nutrients you need and are low in calories. Decrease your intake of foods high in solid fats, added sugars, and salt. Get information about a proper diet from your health care provider, if necessary.  Regular physical exercise is one of the most important things you can do for your health. Most adults should get at least 150 minutes of moderate-intensity exercise (any activity that increases your heart rate and causes you to sweat) each week. In addition, most adults need muscle-strengthening exercises on 2 or more days a week.   Maintain a healthy weight. The body mass index (BMI) is a screening tool to identify possible weight problems. It provides an estimate of body fat based on height and weight. Your health care provider can find your BMI and can help you achieve or maintain a healthy weight. For males 20 years and older:  A BMI below 18.5 is considered underweight.  A BMI of 18.5 to 24.9 is normal.  A BMI of 25 to 29.9 is considered overweight.  A BMI of 30 and above is considered obese.  Maintain normal blood lipids and cholesterol by exercising and minimizing your intake of saturated fat. Eat a balanced  diet with plenty of fruits and vegetables. Blood tests for lipids and cholesterol should begin at age 38 and be repeated every 5 years. If your lipid or cholesterol levels are high, you are over age 38, or you are at high risk for heart disease, you may need your cholesterol levels checked more frequently.Ongoing high lipid and cholesterol levels should be treated with medicines if diet and exercise are not working.  If you smoke, find out from your health care provider how to quit. If you do not use tobacco, do not start.  Lung cancer screening is recommended for adults aged 55-80 years who are at high risk for developing lung cancer because of a history of smoking. A yearly low-dose CT scan of the lungs is recommended for people who have at least a 30-pack-year history of smoking and are current smokers or have quit within the past 15 years. A pack year of smoking is smoking an average of 1 pack of cigarettes a day for 1 year (for example, a 30-pack-year history of smoking could mean smoking 1 pack a day for 30 years or 2 packs a day for 15 years). Yearly screening should continue until the smoker has stopped smoking for at least 15 years. Yearly screening should be stopped for people who develop a health problem that would prevent them from having lung cancer treatment.  If you choose to drink alcohol, do not have  more than 2 drinks per day. One drink is considered to be 12 oz (360 mL) of beer, 5 oz (150 mL) of wine, or 1.5 oz (45 mL) of liquor.  Avoid the use of street drugs. Do not share needles with anyone. Ask for help if you need support or instructions about stopping the use of drugs.  High blood pressure causes heart disease and increases the risk of stroke. Blood pressure should be checked at least every 1-2 years. Ongoing high blood pressure should be treated with medicines if weight loss and exercise are not effective.  If you are 37-63 years old, ask your health care provider if you should  take aspirin to prevent heart disease.  Diabetes screening involves taking a blood sample to check your fasting blood sugar level. This should be done once every 3 years after age 23 if you are at a normal weight and without risk factors for diabetes. Testing should be considered at a younger age or be carried out more frequently if you are overweight and have at least 1 risk factor for diabetes.  Colorectal cancer can be detected and often prevented. Most routine colorectal cancer screening begins at the age of 67 and continues through age 60. However, your health care provider may recommend screening at an earlier age if you have risk factors for colon cancer. On a yearly basis, your health care provider may provide home test kits to check for hidden blood in the stool. A small camera at the end of a tube may be used to directly examine the colon (sigmoidoscopy or colonoscopy) to detect the earliest forms of colorectal cancer. Talk to your health care provider about this at age 29 when routine screening begins. A direct exam of the colon should be repeated every 5-10 years through age 76, unless early forms of precancerous polyps or small growths are found.  People who are at an increased risk for hepatitis B should be screened for this virus. You are considered at high risk for hepatitis B if:  You were born in a country where hepatitis B occurs often. Talk with your health care provider about which countries are considered high risk.  Your parents were born in a high-risk country and you have not received a shot to protect against hepatitis B (hepatitis B vaccine).  You have HIV or AIDS.  You use needles to inject street drugs.  You live with, or have sex with, someone who has hepatitis B.  You are a man who has sex with other men (MSM).  You get hemodialysis treatment.  You take certain medicines for conditions like cancer, organ transplantation, and autoimmune conditions.  Hepatitis C  blood testing is recommended for all people born from [birth year] through 1965 and any individual with known risk factors for hepatitis C.  Healthy men should no longer receive prostate-specific antigen (PSA) blood tests as part of routine cancer screening. Talk to your health care provider about prostate cancer screening.  Testicular cancer screening is not recommended for adolescents or adult males who have no symptoms. Screening includes self-exam, a health care provider exam, and other screening tests. Consult with your health care provider about any symptoms you have or any concerns you have about testicular cancer.  Practice safe sex. Use condoms and avoid high-risk sexual practices to reduce the spread of sexually transmitted infections (STIs).  You should be screened for STIs, including gonorrhea and chlamydia if:  You are sexually active and are younger than 24 years.  You are older than 24 years, and your health care provider tells you that you are at risk for this type of infection.  Your sexual activity has changed since you were last screened, and you are at an increased risk for chlamydia or gonorrhea. Ask your health care provider if you are at risk.  If you are at risk of being infected with HIV, it is recommended that you take a prescription medicine daily to prevent HIV infection. This is called pre-exposure prophylaxis (PrEP). You are considered at risk if:  You are a man who has sex with other men (MSM).  You are a heterosexual man who is sexually active with multiple partners.  You take drugs by injection.  You are sexually active with a partner who has HIV.  Talk with your health care provider about whether you are at high risk of being infected with HIV. If you choose to begin PrEP, you should first be tested for HIV. You should then be tested every 3 months for as long as you are taking PrEP.  Use sunscreen. Apply sunscreen liberally and repeatedly throughout the day. You  should seek shade when your shadow is shorter than you. Protect yourself by wearing long sleeves, pants, a wide-brimmed hat, and sunglasses year round whenever you are outdoors.  Tell your health care provider of new moles or changes in moles, especially if there is a change in shape or color. Also, tell your health care provider if a mole is larger than the size of a pencil eraser.  A one-time screening for abdominal aortic aneurysm (AAA) and surgical repair of large AAAs by ultrasound is recommended for men aged 65-75 years who are current or former smokers.  Stay current with your vaccines (immunizations). Document Released: 05/05/2008 Document Revised: 11/12/2013 Document Reviewed: 04/04/2011 [facility] Patient Information 2015 [place], [place]. This information is not intended to replace advice given to you by your health care provider. Make sure you discuss any questions you have with your health care provider.

## 2015-05-04 NOTE — Progress Notes (Signed)
Patient ID: Billy [name], male    DOB: 05-02-1951  Age: 64 y.o. MRN: [id number]  The patient is here for annual wellness examination and management of other chronic and acute problems.   The risk factors are reflected in the social history.  The roster of all physicians providing medical care to patient - is listed in the Snapshot section of the chart.  Activities of daily living:  The patient is 100% independent in all ADLs: dressing, toileting, feeding as well as independent mobility  Home safety : The patient has smoke detectors in the home. They wear seatbelts.  There are no firearms at home. There is no violence in the home.   There is no risks for hepatitis, STDs or HIV. There is no   history of blood transfusion. They have no travel history to infectious disease endemic areas of the world.  The patient has not seen his dentist in the last six month. He is looking for a new provider and was referred tro Dr Tiburcio Pea They have seen their eye doctor in the last year. They admit to slight hearing difficulty with regard to whispered voices and some television programs.  They have deferred audiologic testing in the last year.  They do not  have excessive sun exposure. Discussed the need for sun protection: hats, long sleeves and use of sunscreen if there is significant sun exposure.   Diet: the importance of a healthy diet is discussed. They do have a healthy diet. He avoids red meat and bread unless it is low carb high fiber bread.    The benefits of regular aerobic exercise were discussed. He works in the yard 1-2 times per week and is walking and doing Tai Chi for balance and core. He has lost 10 lbs since his last visit    Wt Readings from Last 3 Encounters:  05/04/15 273 lb 8 oz (124.059 kg)  03/19/15 283 lb 12 oz (128.708 kg)  01/27/15 277 lb 4 oz (125.76 kg)      Depression screen: there are no signs or vegative symptoms of depression- irritability, change in appetite, anhedonia,  sadness/tearfullness.  Cognitive assessment: the patient manages all their financial and personal affairs and is actively engaged. They could relate day,date,year and events; recalled 2/3 objects at 3 minutes; performed clock-face test normally.  The following portions of the patient's history were reviewed and updated as appropriate: allergies, current medications, past family history, past medical history,  past surgical history, past social history  and problem list.  Visual acuity was not assessed per patient preference since she has regular follow up with her ophthalmologist. Hearing and body mass index were assessed and reviewed.   During the course of the visit the patient was educated and counseled about appropriate screening and preventive services including : fall prevention , diabetes screening, nutrition counseling, colorectal cancer screening, and recommended immunizations.    CC: The primary encounter diagnosis was NSTEMI (non-ST elevated myocardial infarction). Diagnoses of Visit for preventive health examination, Thrombocytopenia, Screening for prostate cancer, Need for hepatitis C screening test, Long-term use of high-risk medication, Need for shingles vaccine, and Obesity (BMI 30-39.9) were also pertinent to this visit.  Hospitalized twice since last visit for sustained VT with no ICSD firing ,  Has been reset twice.  second admission due to recurrent VT despite amiodarone,  Yet dose was decreasd from 400 mg to 200 mg daily . Cardiac cath was done,  Severe CAD. Tow admissions in  Dec and  jan ,  VT managed by [facility].  NOw taking amiodarone    History Billy [name] has a past medical history of Ischemic cardiomyopathy; LBBB (left bundle branch block); Obesity; Hypertension; Ventricular tachycardia; Chicken pox; Heart disease; Hyperlipidemia; Kidney stone; Gout; Automatic implantable cardioverter-defibrillator in situ; Myocardial infarction (1991); and Arthritis.   He has past surgical  history that includes Implantable cardioverter defibrillator implant (2008); Cholecystectomy; Appendectomy (1955); Knee surgery (2005); Bi-ventricular implantable cardioverter defibrillator upgrade (10-16-2013); Icd generator change (10/16/2013); Biv icd genertaor change out (N/A, 10/16/2013); and Cardiac catheterization (11/10/2014).   His family history includes Arthritis in his mother; Heart disease in his father; Heart disease (age of onset: 56) in his mother; Mental illness in his father.He reports that he quit smoking about 24 years ago. His smoking use included Cigarettes. He has a 30 pack-year smoking history. He has never used smokeless tobacco. He reports that he drinks about 1.5 oz of alcohol per week. He reports that he does not use illicit drugs.  Outpatient Prescriptions Prior to Visit  Medication Sig Dispense Refill  . amiodarone (PACERONE) 200 MG tablet Take 1 tablet (200 mg total) by mouth daily. 30 tablet 6  . aspirin 81 MG tablet Take 81 mg by mouth daily.      Marland Kitchen atorvastatin (LIPITOR) 40 MG tablet Take 40 mg (one tablet) three times a week 30 tablet 6  . carvedilol (COREG) 25 MG tablet Take 25 mg (one tablet) in the morning and 12.5 mg (1/2 tablet) at night 30 tablet 6  . furosemide (LASIX) 40 MG tablet Take 1 tablet (40 mg total) by mouth 2 (two) times daily as needed. 60 tablet 6  . magnesium oxide (MAG-OX) 400 MG tablet Take 400 mg by mouth every other day.    . multivitamin (THERAGRAN) per tablet Take 1 tablet by mouth daily.      . nitroGLYCERIN (NITROSTAT) 0.4 MG SL tablet Place 1 tablet (0.4 mg total) under the tongue every 5 (five) minutes as needed for chest pain. 25 tablet 3  . Omega-3 Fatty Acids (FISH OIL) 1000 MG CAPS Take 1,000 mg by mouth every other day.     . enalapril (VASOTEC) 20 MG tablet TAKE ONE-HALF TABLET BY MOUTH TWICE DAILY 30 tablet 0   No facility-administered medications prior to visit.    Review of Systems  Patient denies headache, fevers,  malaise, unintentional weight loss, skin rash, eye pain, sinus congestion and sinus pain, sore throat, dysphagia,  hemoptysis , cough, dyspnea, wheezing, chest pain, palpitations, orthopnea, edema, abdominal pain, nausea, melena, diarrhea, constipation, flank pain, dysuria, hematuria, urinary  Frequency, nocturia, numbness, tingling, seizures,  Focal weakness, Loss of consciousness,  Tremor, insomnia, depression, anxiety, and suicidal ideation.     Objective:  BP 104/64 mmHg  Pulse 54  Temp(Src) 97.5 F (36.4 C) (Oral)  Resp 14  Ht 5' 10"  (1.778 m)  Wt 273 lb 8 oz (124.059 kg)  BMI 39.24 kg/m2  SpO2 96%  Physical Exam  General appearance: alert,obese cooperative and appears stated age Ears: normal TM's and external ear canals both ears Throat: lips, mucosa, and tongue normal; teeth and gums normal Neck: no adenopathy, no carotid bruit, supple, symmetrical, trachea midline and thyroid not enlarged, symmetric, no tenderness/mass/nodules Back: symmetric, no curvature. ROM normal. No CVA tenderness. Lungs: clear to auscultation bilaterally Heart: regular rate and rhythm, S1, S2 normal, no murmur, click, rub or gallop Abdomen: soft, non-tender; bowel sounds normal; no masses,  no organomegaly Pulses: 2+ and symmetric Skin: Skin color,  texture, turgor normal. No rashes or lesions Lymph nodes: Cervical, supraclavicular, and axillary nodes normal.   Assessment & Plan:   Problem List Items Addressed This Visit    Obesity (BMI 30-39.9)    I have congratulated him in reduction of   BMI and encouraged  Continued weight loss with goal of 10% of body weight over the next 6 months using a low glycemic index diet and regular exercise a minimum of 5 days per week.        Visit for preventive health examination    Annual wellness  exam was done as well as a comprehensive physical exam and management of acute and chronic conditions .  During the course of the visit the patient was educated and  counseled about appropriate screening and preventive services including :  diabetes screening, lipid analysis with projected  10 year  risk for CAD , nutrition counseling, colorectal cancer screening, and recommended immunizations.  Printed recommendations for health maintenance screenings was given.       Thrombocytopenia   Relevant Orders   CBC with Differential/Platelet (Completed)   NSTEMI (non-ST elevated myocardial infarction) - Primary   Relevant Medications   enalapril (VASOTEC) 20 MG tablet    Other Visit Diagnoses    Screening for prostate cancer        Relevant Orders    PSA (Completed)    Need for hepatitis C screening test        Relevant Orders    Hepatitis C antibody (Completed)    Long-term use of high-risk medication        Relevant Orders    Comprehensive metabolic panel (Completed)    TSH (Completed)    Need for shingles vaccine        Relevant Orders    Varicella-zoster vaccine subcutaneous (Completed)       I am having Billy [name] maintain his aspirin, Fish Oil, multivitamin, magnesium oxide, nitroGLYCERIN, carvedilol, atorvastatin, amiodarone, furosemide, and enalapril.  Meds ordered this encounter  Medications  . DISCONTD: enalapril (VASOTEC) 20 MG tablet    Sig: TAKE ONE-HALF TABLET BY MOUTH TWICE DAILY    Dispense:  30 tablet    Refill:  0    MUST KEEP APPT ON 6/13 FOR FURTHER REFILLS  . enalapril (VASOTEC) 20 MG tablet    Sig: TAKE ONE-HALF TABLET BY MOUTH TWICE DAILY    Dispense:  9 tablet    Refill:  1    Medications Discontinued During This Encounter  Medication Reason  . enalapril (VASOTEC) 20 MG tablet Reorder  . enalapril (VASOTEC) 20 MG tablet Reorder    Follow-up: No Follow-up on file.   Crecencio Mc, MD

## 2015-05-04 NOTE — Progress Notes (Signed)
Pre-visit discussion using our clinic review tool. No additional management support is needed unless otherwise documented below in the visit note.  

## 2015-05-05 LAB — CBC WITH DIFFERENTIAL/PLATELET
BASOS ABS: 0.1 10*3/uL (ref 0.0–0.1)
BASOS PCT: 1.1 % (ref 0.0–3.0)
Eosinophils Absolute: 0.1 10*3/uL (ref 0.0–0.7)
Eosinophils Relative: 2.5 % (ref 0.0–5.0)
HCT: 34.3 % — ABNORMAL LOW (ref 39.0–52.0)
Hemoglobin: 11.6 g/dL — ABNORMAL LOW (ref 13.0–17.0)
LYMPHS PCT: 34.4 % (ref 12.0–46.0)
Lymphs Abs: 1.7 10*3/uL (ref 0.7–4.0)
MCHC: 34 g/dL (ref 30.0–36.0)
MCV: 92.3 fl (ref 78.0–100.0)
MONO ABS: 0.8 10*3/uL (ref 0.1–1.0)
Monocytes Relative: 15.5 % — ABNORMAL HIGH (ref 3.0–12.0)
NEUTROS PCT: 46.5 % (ref 43.0–77.0)
Neutro Abs: 2.3 10*3/uL (ref 1.4–7.7)
PLATELETS: 178 10*3/uL (ref 150.0–400.0)
RBC: 3.72 Mil/uL — AB (ref 4.22–5.81)
RDW: 14.2 % (ref 11.5–15.5)
WBC: 4.9 10*3/uL (ref 4.0–10.5)

## 2015-05-05 LAB — COMPREHENSIVE METABOLIC PANEL
ALBUMIN: 3.8 g/dL (ref 3.5–5.2)
ALT: 34 U/L (ref 0–53)
AST: 27 U/L (ref 0–37)
Alkaline Phosphatase: 68 U/L (ref 39–117)
BUN: 37 mg/dL — ABNORMAL HIGH (ref 6–23)
CALCIUM: 8.8 mg/dL (ref 8.4–10.5)
CO2: 28 meq/L (ref 19–32)
Chloride: 101 mEq/L (ref 96–112)
Creatinine, Ser: 2.48 mg/dL — ABNORMAL HIGH (ref 0.40–1.50)
GFR: 28.02 mL/min — AB (ref >60.00)
Glucose, Bld: 127 mg/dL — ABNORMAL HIGH (ref 70–99)
POTASSIUM: 4 meq/L (ref 3.5–5.1)
Sodium: 135 mEq/L (ref 135–145)
TOTAL PROTEIN: 6.4 g/dL (ref 6.0–8.3)
Total Bilirubin: 0.6 mg/dL (ref 0.2–1.2)

## 2015-05-05 LAB — PSA: PSA: 0.33 ng/mL (ref 0.10–4.00)

## 2015-05-05 LAB — TSH: TSH: 2.25 u[IU]/mL (ref 0.35–4.50)

## 2015-05-05 LAB — HEPATITIS C ANTIBODY: HCV AB: NEGATIVE

## 2015-05-05 NOTE — Assessment & Plan Note (Signed)

## 2015-05-05 NOTE — Assessment & Plan Note (Signed)
I have congratulated him in reduction of   BMI and encouraged  Continued weight loss with goal of 10% of body weight over the next 6 months using a low glycemic index diet and regular exercise a minimum of 5 days per week.   

## 2015-05-10 ENCOUNTER — Encounter: Payer: Self-pay | Admitting: Internal Medicine

## 2015-05-10 ENCOUNTER — Other Ambulatory Visit: Payer: Self-pay | Admitting: Internal Medicine

## 2015-05-10 DIAGNOSIS — R944 Abnormal results of kidney function studies: Secondary | ICD-10-CM

## 2015-05-12 ENCOUNTER — Telehealth: Payer: Self-pay | Admitting: *Deleted

## 2015-05-12 NOTE — Telephone Encounter (Signed)
Pt c/o medication issue:  1. Name of Medication: Amiodarone   2. How are you currently taking this medication (dosage and times per day)? Once a day around 4 to 4:30 pm   3. Are you having a reaction (difficulty breathing--STAT)? No   4. What is your medication issue? Heart rate gets low,take is around 4 pm. Just doesn't feel well.  Please advise

## 2015-05-12 NOTE — Telephone Encounter (Signed)
Left message on machine for patient to contact the office.   

## 2015-05-13 ENCOUNTER — Encounter: Payer: Self-pay | Admitting: Cardiology

## 2015-05-13 NOTE — Telephone Encounter (Signed)
[name] who indicates in the past week, his heart rate dips to 32-34 bpm two hours after taking amiodarone.  States he is having muscle fatigue as well. Denies any other symptoms. Pt feels 200mg  dosage may be too strong. States he did not take it yesterday and his heart rate this morning is 60. Will forward to Dr. Graciela Husbands.

## 2015-05-14 NOTE — Telephone Encounter (Signed)
Needs 12 lead please and then call me thanks

## 2015-05-14 NOTE — Telephone Encounter (Signed)
Left message on machine for patient to contact the office.   

## 2015-05-15 NOTE — Telephone Encounter (Signed)
[name] who states HR continues to be low. States he is not taking amiodarone. Informed patient of Dr. Odessa Fleming request to come in for EKG. Pt states he is unable to come today due to his father's funeral but made appt for Monday, June 27, 4pm.

## 2015-05-18 ENCOUNTER — Ambulatory Visit (INDEPENDENT_AMBULATORY_CARE_PROVIDER_SITE_OTHER): Payer: BLUE CROSS/BLUE SHIELD | Admitting: *Deleted

## 2015-05-18 ENCOUNTER — Encounter: Payer: Self-pay | Admitting: Internal Medicine

## 2015-05-18 VITALS — Ht 70.0 in | Wt 275.5 lb

## 2015-05-18 DIAGNOSIS — R001 Bradycardia, unspecified: Secondary | ICD-10-CM | POA: Diagnosis not present

## 2015-05-18 NOTE — Progress Notes (Signed)
1.) Reason for visit: EKG  2.) Name of MD requesting visit: Graciela Husbands  3.) H&P: The patient has a history ischemic cardiomyopathy and VT. He has a Bi-V ICD in place. He called the office last week and spoke with triage regarding HR's dropping in the 30's after taking amiodarone. Dr. Graciela Husbands advised he come to the office for a follow up EKG to be done. He was unable to come in last week due to a funeral and presented today for follow up.  4.) ROS related to problem: EKG performed- paced rhythm HR- 48-56 in office. The patient is complaining of some weakness. BP low at 98/60. He stopped amiodarone last week on his own due to low HR's  5.) Assessment and plan per MD: Reviewed with Dr. Graciela Husbands by phone. He reviewed the patient's last remote transmission scanned in EPIC. He is DDD with a lower rate at 48 bpm. Per Dr. Graciela Husbands, the patient may have been having PVC's last week when his HR was calculated in the 30's. His percentage of PVC's is high on last interrogation. Dr. Graciela Husbands instructed to the patient to resume amiodarone and come in 05/19/15 to see Leonette Most and have his device settings adjusted. I have also advised him due to his weakness and low BP in the office today, to hold his evening dose of coreg. He voices understanding.

## 2015-05-19 ENCOUNTER — Ambulatory Visit (INDEPENDENT_AMBULATORY_CARE_PROVIDER_SITE_OTHER): Payer: BLUE CROSS/BLUE SHIELD | Admitting: *Deleted

## 2015-05-19 DIAGNOSIS — I493 Ventricular premature depolarization: Secondary | ICD-10-CM

## 2015-05-19 DIAGNOSIS — I959 Hypotension, unspecified: Secondary | ICD-10-CM | POA: Diagnosis not present

## 2015-05-19 LAB — CUP PACEART INCLINIC DEVICE CHECK
Brady Statistic RA Percent Paced: 1 % — CL
Brady Statistic RV Percent Paced: 88 %
HighPow Impedance: 38 Ohm
Lead Channel Impedance Value: 465 Ohm
Lead Channel Sensing Intrinsic Amplitude: 3.4 mV
Lead Channel Setting Pacing Amplitude: 2.3 V
Lead Channel Setting Pacing Amplitude: 2.4 V
Lead Channel Setting Pacing Pulse Width: 0.5 ms
Lead Channel Setting Sensing Sensitivity: 1 mV
MDC IDC MSMT LEADCHNL RA IMPEDANCE VALUE: 593 Ohm
MDC IDC MSMT LEADCHNL RV IMPEDANCE VALUE: 472 Ohm
MDC IDC SESS DTM: 20160628094355
MDC IDC SET LEADCHNL LV PACING PULSEWIDTH: 0.5 ms
MDC IDC SET LEADCHNL RA PACING AMPLITUDE: 2 V
MDC IDC SET LEADCHNL RV SENSING SENSITIVITY: 0.6 mV
MDC IDC SET ZONE DETECTION INTERVAL: 250 ms
MDC IDC SET ZONE DETECTION INTERVAL: 462 ms
Pulse Gen Serial Number: 101974
Zone Setting Detection Interval: 316 ms

## 2015-05-19 MED ORDER — CARVEDILOL 6.25 MG PO TABS: 6.2500 mg | ORAL_TABLET | Freq: Two times a day (BID) | ORAL | Status: DC

## 2015-05-19 NOTE — Patient Instructions (Addendum)
Medication Instructions: - Decrease coreg (carvedilol) to 6.25 mg one tablet by mouth twice daily - hold enalapril for now  Labwork: - Your physician recommends that you have lab work today: Cortisol level  Procedures/Testing: - none  Follow-Up: - as scheduled with Dr. Graciela HusbandsKlein.  Any Additional Special Instructions Will Be Listed Below (If Applicable).

## 2015-05-19 NOTE — Progress Notes (Signed)
Pt presenting with intermittent bigeminal PVCs. EKG yesterday shows pacing at lower rate of 45bpm. Changed mode from DDD/45 to DDDR/60 to overdrive pace. Multiple med changes reviewed by Margaretmary DysHeather McGee, RN. Change showed less frequent PVCs while in office. ROV as planned w/ SK/B 06/09/15.

## 2015-05-20 LAB — CORTISOL: Cortisol: 17.5 ug/dL

## 2015-06-09 ENCOUNTER — Encounter: Payer: Self-pay | Admitting: Internal Medicine

## 2015-06-09 ENCOUNTER — Ambulatory Visit (INDEPENDENT_AMBULATORY_CARE_PROVIDER_SITE_OTHER): Payer: BLUE CROSS/BLUE SHIELD | Admitting: Internal Medicine

## 2015-06-09 VITALS — BP 126/80 | HR 67 | Ht 70.0 in | Wt 268.5 lb

## 2015-06-09 DIAGNOSIS — I5022 Chronic systolic (congestive) heart failure: Secondary | ICD-10-CM

## 2015-06-09 DIAGNOSIS — I255 Ischemic cardiomyopathy: Secondary | ICD-10-CM

## 2015-06-09 DIAGNOSIS — N289 Disorder of kidney and ureter, unspecified: Secondary | ICD-10-CM

## 2015-06-09 DIAGNOSIS — Z95810 Presence of automatic (implantable) cardiac defibrillator: Secondary | ICD-10-CM | POA: Diagnosis not present

## 2015-06-09 LAB — CUP PACEART INCLINIC DEVICE CHECK
Date Time Interrogation Session: 20160719040000
HighPow Impedance: 44 Ohm
Lead Channel Impedance Value: 631 Ohm
Lead Channel Pacing Threshold Pulse Width: 0.5 ms
Lead Channel Sensing Intrinsic Amplitude: 3.5 mV
Lead Channel Setting Pacing Amplitude: 2.4 V
Lead Channel Setting Pacing Pulse Width: 0.5 ms
Lead Channel Setting Sensing Sensitivity: 0.6 mV
MDC IDC MSMT LEADCHNL LV IMPEDANCE VALUE: 524 Ohm
MDC IDC MSMT LEADCHNL LV PACING THRESHOLD AMPLITUDE: 1.7 V
MDC IDC MSMT LEADCHNL LV SENSING INTR AMPL: 25 mV — AB
MDC IDC MSMT LEADCHNL RA PACING THRESHOLD AMPLITUDE: 0.6 V
MDC IDC MSMT LEADCHNL RA PACING THRESHOLD PULSEWIDTH: 0.5 ms
MDC IDC MSMT LEADCHNL RV IMPEDANCE VALUE: 483 Ohm
MDC IDC MSMT LEADCHNL RV PACING THRESHOLD AMPLITUDE: 0.8 V
MDC IDC MSMT LEADCHNL RV PACING THRESHOLD PULSEWIDTH: 0.5 ms
MDC IDC MSMT LEADCHNL RV SENSING INTR AMPL: 19.2 mV
MDC IDC SET LEADCHNL LV PACING AMPLITUDE: 2.7 V
MDC IDC SET LEADCHNL LV SENSING SENSITIVITY: 1 mV
MDC IDC SET LEADCHNL RA PACING AMPLITUDE: 2 V
MDC IDC SET LEADCHNL RV PACING PULSEWIDTH: 0.5 ms
MDC IDC SET ZONE DETECTION INTERVAL: 462 ms
MDC IDC STAT BRADY RA PERCENT PACED: 8 %
MDC IDC STAT BRADY RV PERCENT PACED: 89 %
Pulse Gen Serial Number: 101974
Zone Setting Detection Interval: 250 ms
Zone Setting Detection Interval: 316 ms

## 2015-06-09 NOTE — Progress Notes (Signed)
Electrophysiology Office Note   Date:  06/09/2015   ID:  Billy [name], DOB 10/11/1951, MRN [id number]  PCP:  Crecencio Mc, MD  Cardiologist:  tg Primary Electrophysiologist:    Virl Axe, MD    Chief Complaint  Patient presents with  . other    No complaints. Meds reviewed verbally with pt.     History of Present Illness: Billy [name] is a 64 y.o. male seen today as an add-on because of recent problems with ventricular tachycardia. In December he had an episode where he became short of breath and noted persistent tachycardia. EMS was called and his heart rate was 180. Device interrogation confirmed ventricular tachycardia. Amiodarone was initiated. Catheterization  12/15 demonstrated a 95% LAD stenosis   it is not clear to me what the intention was following this finding.  He had recurrent ventricular tachycardia in early January. The heart rate at that time was about 145. His device was programmed prior to that about 160. Most recently he was seen in late January at which time his amiodarone dose was decreased.  Dr. Deidre Ala was to review with interventional cardiology plans to the LAD lesion there is been no further ventricular tachycardia. His dyspnea is much improved. His sleep disturbances are resolved. Arthralgias are resolved following the medication adjustments from the last visit.  Efforts to begin Aldactone were interrupted by worsening renal function; unfortunately, his creatinine bumped again and his Ace inhibitors were discontinued. His creatinine has not been rechecked.  He has mild peripheral edema denies chest pain there've been no palpitations or syncope       Past Medical History  Diagnosis Date  . Ischemic cardiomyopathy     s/p ICD implant 2008 with subsequent upgrade to CRTD 2014  . LBBB (left bundle branch block)   . Obesity   . Hypertension   . Ventricular tachycardia     syncopal  treated via ICD  . Chicken pox   . Heart disease   .  Hyperlipidemia   . Kidney stone   . Gout   . Automatic implantable cardioverter-defibrillator in situ   . Myocardial infarction 1991  . Arthritis    Past Surgical History  Procedure Laterality Date  . Implantable cardioverter defibrillator implant  2008    as part of MADIT-CRT trial - single chamber device  . Cholecystectomy    . Appendectomy  1955  . Knee surgery  2005  . Bi-ventricular implantable cardioverter defibrillator upgrade  10-16-2013    CRTD upgrade by Dr Caryl Comes  . Icd generator change  10/16/2013    Dr Caryl Comes  . Biv icd genertaor change out N/A 10/16/2013    Procedure: BIV ICD GENERTAOR CHANGE OUT;  Surgeon: Deboraha Sprang, MD;  Location: [facility] CATH LAB;  Service: Cardiovascular;  Laterality: N/A;  . Cardiac catheterization  11/10/2014    [facility]     Current Outpatient Prescriptions  Medication Sig Dispense Refill  . amiodarone (PACERONE) 200 MG tablet Take 1 tablet (200 mg total) by mouth daily. 30 tablet 6  . aspirin 81 MG tablet Take 81 mg by mouth daily.      Marland Kitchen atorvastatin (LIPITOR) 40 MG tablet Take 40 mg (one tablet) three times a week 30 tablet 6  . carvedilol (COREG) 6.25 MG tablet Take 1 tablet (6.25 mg total) by mouth 2 (two) times daily. 60 tablet 6  . furosemide (LASIX) 40 MG tablet Take 1 tablet (40 mg total) by mouth 2 (two) times daily as  needed. 60 tablet 6  . magnesium oxide (MAG-OX) 400 MG tablet Take 400 mg by mouth every other day.    . multivitamin (THERAGRAN) per tablet Take 1 tablet by mouth daily.      . nitroGLYCERIN (NITROSTAT) 0.4 MG SL tablet Place 1 tablet (0.4 mg total) under the tongue every 5 (five) minutes as needed for chest pain. 25 tablet 3  . Omega-3 Fatty Acids (FISH OIL) 1000 MG CAPS Take 1,000 mg by mouth every other day.      No current facility-administered medications for this visit.    Allergies:   Review of patient's allergies indicates no known allergies.   Social History:  The patient  reports that he quit smoking about 25  years ago. His smoking use included Cigarettes. He has a 30 pack-year smoking history. He has never used smokeless tobacco. He reports that he drinks about 1.5 oz of alcohol per week. He reports that he does not use illicit drugs.   Family History:  The patient's family history includes Arthritis in his mother; Heart disease in his father; Heart disease (age of onset: 54) in his mother; Mental illness in his father.    ROS:  Please see the history of present illness.   .   All other systems are reviewed and negative.    PHYSICAL EXAM: VS:  BP 126/80 mmHg  Pulse 67  Ht 5' 10"  (1.778 m)  Wt 268 lb 8 oz (121.791 kg)  BMI 38.53 kg/m2 , BMI Body mass index is 38.53 kg/(m^2). GEN: Well nourished, well developed, in no acute distress HEENT: normal Neck: n8 cm JVD, carotid bruits, or masses Cardiac: REGULAR RATE and RHYTHM ; 2/6  murmurs, rubs,  No S4  Back without kyphosis or CVAT Respiratory:  clear to auscultation bilaterally, normal work of breathing GI: soft, nontender, nondistended, + BS MS: no deformity or atrophy Extremities no clubbing cyanosis  2+edema Skin: warm and dry,  device pocket is well healed Neuro:  Strength and sensation are intact Psych: euthymic mood, full affect  EKG:  EKG is ordered today. The ekg ordered today shows nsr    Device interrogation is reviewed today in detail.  See PaceArt for details.   Recent Labs: 05/04/2015: ALT 34; BUN 37*; Creatinine, Ser 2.48*; Hemoglobin 11.6*; Platelets 178.0; Potassium 4.0; Sodium 135; TSH 2.25    Lipid Panel     Component Value Date/Time   CHOL 156 02/26/2014 1532   TRIG 67.0 02/26/2014 1532   HDL 63.00 02/26/2014 1532   CHOLHDL 2 02/26/2014 1532   VLDL 13.4 02/26/2014 1532   LDLCALC 80 02/26/2014 1532     Wt Readings from Last 3 Encounters:  06/09/15 268 lb 8 oz (121.791 kg)  05/18/15 275 lb 8 oz (124.966 kg)  05/04/15 273 lb 8 oz (124.059 kg)      Other studies Reviewed: Additional studies/ records  that were reviewed today include: none   Review of the above records today demonstrates: as abiove   ASSESSMENT AND PLAN:  Ventricular tachycardia  Ischemic cardiomyopathy  Left ventricular apical thrombus  Implantable defibrillator-  [employer]  Morbid obesity  Congestive heart failure-chronic systolic  Ecchymoses  Renal insufficiency grade 4  Dream Disturbances  Myalgias/arthralgias  He is much much better. He is now sleeping without sleep disturbances on a different beta blocker  Dizziness is improved  His creatinine has deteriorated significantly over the last 18 months. The most recent one was about 4 weeks ago demonstrated significant interval worsening  1.8--2.5; we will recheck it today. He is not a candidate for Aldactone; that had been discontinued previously. In the event that his creatinine remains elevated, would consider the use of BiDil.  Myalgias are improved following the reduction of his statin.  No interval ventricular tachycardia. We'll continue him on amiodarone. Surveillance laboratories were normal 6/16; ESR 3/16 obtained for concerns about amiodarone lung injury -- normal     Current medicines are reviewed at length with the patient today.   The patient does not  concerns regarding his medicines.  The following changes were made today:  Dose down titration as noted  Labs/ tests ordered today include:    No orders of the defined types were placed in this encounter.     Disposition:   FU with me 4  month(s)  Signed, Virl Axe, MD  06/09/2015 8:44 AM     [address] [place] [phone] (office) [phone] (fax)

## 2015-06-09 NOTE — Patient Instructions (Signed)
Medication Instructions: - no changes  Labwork: - Your physician recommends that you have lab work today: BMP  Procedures/Testing: - none  Follow-Up: - Remote monitoring is used to monitor your Pacemaker of ICD from home. This monitoring reduces the number of office visits required to check your device to one time per year. It allows us to keep an eye on the functioning of your device to ensure it is working properly. You are scheduled for a device check from home on 09/08/15. You may send your transmission at any time that day. If you have a wireless device, the transmission will be sent automatically. After your physician reviews your transmission, you will receive a postcard with your next transmission date.  - Your physician wants you to follow-up in: 6 months with Dr. Graciela HusbandsKlein (January 2017) You will receive a reminder letter in the mail two months in advance. If you don't receive a letter, please call our office to schedule the follow-up appointment.  Any Additional Special Instructions Will Be Listed Below (If Applicable).

## 2015-06-10 LAB — BASIC METABOLIC PANEL
BUN/Creatinine Ratio: 13 (ref 10–22)
BUN: 20 mg/dL (ref 8–27)
CALCIUM: 9.1 mg/dL (ref 8.6–10.2)
CHLORIDE: 102 mmol/L (ref 97–108)
CO2: 23 mmol/L (ref 18–29)
Creatinine, Ser: 1.56 mg/dL — ABNORMAL HIGH (ref 0.76–1.27)
GFR calc Af Amer: 53 mL/min/{1.73_m2} — ABNORMAL LOW (ref >59)
GFR calc non Af Amer: 46 mL/min/{1.73_m2} — ABNORMAL LOW (ref >59)
GLUCOSE: 89 mg/dL (ref 65–99)
POTASSIUM: 4.5 mmol/L (ref 3.5–5.2)
Sodium: 139 mmol/L (ref 134–144)

## 2015-06-30 ENCOUNTER — Encounter: Payer: Self-pay | Admitting: Internal Medicine

## 2015-07-16 ENCOUNTER — Other Ambulatory Visit: Payer: Self-pay

## 2015-07-16 MED ORDER — AMIODARONE HCL 200 MG PO TABS: 200.0000 mg | ORAL_TABLET | Freq: Every day | ORAL | Status: AC

## 2015-07-16 NOTE — Telephone Encounter (Signed)
Refill sent for amiodarone 

## 2015-08-10 ENCOUNTER — Encounter: Payer: Self-pay | Admitting: Internal Medicine

## 2015-09-08 ENCOUNTER — Encounter: Payer: Self-pay | Admitting: Cardiology

## 2015-09-08 ENCOUNTER — Ambulatory Visit (INDEPENDENT_AMBULATORY_CARE_PROVIDER_SITE_OTHER): Payer: BLUE CROSS/BLUE SHIELD | Admitting: *Deleted

## 2015-09-08 DIAGNOSIS — I255 Ischemic cardiomyopathy: Secondary | ICD-10-CM | POA: Diagnosis not present

## 2015-09-08 LAB — CUP PACEART REMOTE DEVICE CHECK
Battery Remaining Longevity: 96 mo
Battery Remaining Percentage: 100 %
Date Time Interrogation Session: 20161018074100
HighPow Impedance: 46 Ohm
Implantable Lead Implant Date: 20080122
Implantable Lead Implant Date: 20141126
Implantable Lead Model: 158
Implantable Lead Model: 5076
Lead Channel Impedance Value: 476 Ohm
Lead Channel Setting Pacing Amplitude: 2.4 V
Lead Channel Setting Pacing Amplitude: 2.7 V
Lead Channel Setting Pacing Pulse Width: 0.5 ms
Lead Channel Setting Sensing Sensitivity: 1 mV
MDC IDC LEAD IMPLANT DT: 20141126
MDC IDC LEAD LOCATION: 753858
MDC IDC LEAD LOCATION: 753859
MDC IDC LEAD LOCATION: 753860
MDC IDC LEAD SERIAL: 175417
MDC IDC MSMT LEADCHNL LV IMPEDANCE VALUE: 601 Ohm
MDC IDC MSMT LEADCHNL RA IMPEDANCE VALUE: 695 Ohm
MDC IDC MSMT LEADCHNL RA SENSING INTR AMPL: 3.9 mV
MDC IDC PG SERIAL: 101974
MDC IDC SET LEADCHNL LV PACING PULSEWIDTH: 0.5 ms
MDC IDC SET LEADCHNL RA PACING AMPLITUDE: 2 V
MDC IDC SET LEADCHNL RV SENSING SENSITIVITY: 0.6 mV
MDC IDC STAT BRADY RA PERCENT PACED: 22 %
MDC IDC STAT BRADY RV PERCENT PACED: 99 %

## 2015-09-08 NOTE — Progress Notes (Signed)
Remote ICD transmission.   

## 2015-10-26 ENCOUNTER — Encounter: Payer: Self-pay | Admitting: Internal Medicine

## 2015-12-19 ENCOUNTER — Other Ambulatory Visit: Payer: Self-pay | Admitting: Internal Medicine

## 2015-12-21 ENCOUNTER — Other Ambulatory Visit: Payer: Self-pay

## 2015-12-21 MED ORDER — FUROSEMIDE 40 MG PO TABS: 40.0000 mg | ORAL_TABLET | Freq: Two times a day (BID) | ORAL | Status: DC | PRN

## 2015-12-21 MED ORDER — CARVEDILOL 6.25 MG PO TABS: 6.2500 mg | ORAL_TABLET | Freq: Two times a day (BID) | ORAL | Status: DC

## 2015-12-21 NOTE — Telephone Encounter (Signed)
Refill sent for Carvedilol 6.25 mg  

## 2015-12-21 NOTE — Telephone Encounter (Signed)
Refill sent for Furosemide  

## 2015-12-22 ENCOUNTER — Encounter: Payer: Self-pay | Admitting: Internal Medicine

## 2015-12-22 ENCOUNTER — Ambulatory Visit (INDEPENDENT_AMBULATORY_CARE_PROVIDER_SITE_OTHER): Payer: BLUE CROSS/BLUE SHIELD | Admitting: Internal Medicine

## 2015-12-22 VITALS — BP 120/70 | HR 65 | Ht 70.0 in | Wt 280.5 lb

## 2015-12-22 DIAGNOSIS — I10 Essential (primary) hypertension: Secondary | ICD-10-CM

## 2015-12-22 DIAGNOSIS — I255 Ischemic cardiomyopathy: Secondary | ICD-10-CM | POA: Diagnosis not present

## 2015-12-22 DIAGNOSIS — I214 Non-ST elevation (NSTEMI) myocardial infarction: Secondary | ICD-10-CM

## 2015-12-22 DIAGNOSIS — I472 Ventricular tachycardia: Secondary | ICD-10-CM

## 2015-12-22 DIAGNOSIS — I4729 Other ventricular tachycardia: Secondary | ICD-10-CM

## 2015-12-22 DIAGNOSIS — Z79899 Other long term (current) drug therapy: Secondary | ICD-10-CM

## 2015-12-22 MED ORDER — FUROSEMIDE 40 MG PO TABS: 40.0000 mg | ORAL_TABLET | Freq: Two times a day (BID) | ORAL | Status: AC | PRN

## 2015-12-22 MED ORDER — CARVEDILOL 6.25 MG PO TABS: 6.2500 mg | ORAL_TABLET | Freq: Two times a day (BID) | ORAL | Status: AC

## 2015-12-22 NOTE — Patient Instructions (Signed)
Medication Instructions: - Your physician recommends that you continue on your current medications as directed. Please refer to the Current Medication list given to you today.  Labwork: - Your physician recommends that you have lab work today: CMET/ TSH  Procedures/Testing: - Your physician has requested that you have an echocardiogram. Echocardiography is a painless test that uses sound waves to create images of your heart. It provides your doctor with information about the size and shape of your heart and how well your heart's chambers and valves are working. This procedure takes approximately one hour. There are no restrictions for this procedure.  Follow-Up: - Your physician wants you to follow-up in: July 2017 with Dr. Mariah Milling & January 2018 with Dr. Graciela Husbands. You will receive a reminder letter in the mail two months in advance. If you don't receive a letter, please call our office to schedule the follow-up appointment.   Any Additional Special Instructions Will Be Listed Below (If Applicable).     If you need a refill on your cardiac medications before your next appointment, please call your pharmacy.

## 2015-12-22 NOTE — Progress Notes (Signed)
Electrophysiology Office Note   Date:  12/22/2015   ID:  Billy [name], DOB 07-18-1951, MRN [id number]  PCP:  Sherlene Shams, MD  Cardiologist:  tg Primary Electrophysiologist:    Sherryl Manges, MD    Chief Complaint  Patient presents with  . other    6 month follow up. Meds reviewed by the patient verbally. "doing well."      History of Present Illness: Billy [name] is a 65 y.o. male seen in follow-up for ischemic cardiomyopathy congestive heart failure status post CRT-D with a history of treated  ventricular tachycardia.    Catheterization  12/15 demonstrated a 95% LAD stenosis; i still dont know what happened to this EF 25% or so at that time by echo  He had no intercurrent ventricular tachycardia  The patient denies chest pain, shortness of breath, nocturnal dyspnea, orthopnea or peripheral edema.  There have been no palpitations, lightheadedness or syncope.        Past Medical History  Diagnosis Date  . Ischemic cardiomyopathy     s/p ICD implant 2008 with subsequent upgrade to CRTD 2014  . LBBB (left bundle branch block)   . Obesity   . Hypertension   . Ventricular tachycardia (HCC)     syncopal  treated via ICD  . Chicken pox   . Heart disease   . Hyperlipidemia   . Kidney stone   . Gout   . Automatic implantable cardioverter-defibrillator in situ   . Myocardial infarction (HCC) 1991  . Arthritis    Past Surgical History  Procedure Laterality Date  . Implantable cardioverter defibrillator implant  2008    as part of MADIT-CRT trial - single chamber device  . Cholecystectomy    . Appendectomy  1955  . Knee surgery  2005  . Bi-ventricular implantable cardioverter defibrillator upgrade  10-16-2013    CRTD upgrade by Dr Graciela Husbands  . Icd generator change  10/16/2013    Dr Graciela Husbands  . Biv icd genertaor change out N/A 10/16/2013    Procedure: BIV ICD GENERTAOR CHANGE OUT;  Surgeon: Duke Salvia, MD;  Location: [facility] CATH LAB;  Service: Cardiovascular;   Laterality: N/A;  . Cardiac catheterization  11/10/2014    [facility]     Current Outpatient Prescriptions  Medication Sig Dispense Refill  . amiodarone (PACERONE) 200 MG tablet Take 1 tablet (200 mg total) by mouth daily. 30 tablet 6  . aspirin 81 MG tablet Take 81 mg by mouth daily.      Marland Kitchen atorvastatin (LIPITOR) 40 MG tablet Take 40 mg (one tablet) three times a week 30 tablet 6  . carvedilol (COREG) 6.25 MG tablet Take 1 tablet (6.25 mg total) by mouth 2 (two) times daily. 60 tablet 3  . furosemide (LASIX) 40 MG tablet Take 1 tablet (40 mg total) by mouth 2 (two) times daily as needed. 60 tablet 6  . magnesium oxide (MAG-OX) 400 MG tablet Take 400 mg by mouth every other day.    . multivitamin (THERAGRAN) per tablet Take 1 tablet by mouth daily.      . nitroGLYCERIN (NITROSTAT) 0.4 MG SL tablet Place 1 tablet (0.4 mg total) under the tongue every 5 (five) minutes as needed for chest pain. 25 tablet 3  . Omega-3 Fatty Acids (FISH OIL) 1000 MG CAPS Take 1,000 mg by mouth every other day.      No current facility-administered medications for this visit.    Allergies:   Review of patient's allergies indicates  no known allergies.   Social History:  The patient  reports that he quit smoking about 25 years ago. His smoking use included Cigarettes. He has a 30 pack-year smoking history. He has never used smokeless tobacco. He reports that he drinks about 1.5 oz of alcohol per week. He reports that he does not use illicit drugs.   Family History:  The patient's family history includes Arthritis in his mother; Heart disease in his father; Heart disease (age of onset: 28) in his mother; Mental illness in his father.    ROS:  Please see the history of present illness.   .   All other systems are reviewed and negative.    PHYSICAL EXAM: VS:  BP 120/70 mmHg  Pulse 65  Ht  (1.778 m)  Wt 280 lb 8 oz (127.234 kg)  BMI 40.25 kg/m2 , BMI Body mass index is 40.25 kg/(m^2). GEN: Well nourished,  well developed, in no acute distress HEENT: normal Neck: n8 cm JVD, carotid bruits, or masses Cardiac: REGULAR RATE and RHYTHM ; 2/6  murmurs, rubs,  No S4  Back without kyphosis or CVAT Respiratory:  clear to auscultation bilaterally, normal work of breathing GI: soft, nontender, nondistended, + BS MS: no deformity or atrophy Extremities no clubbing cyanosis  No edema Skin: warm and dry,  device pocket is well healed Neuro:  Strength and sensation are intact Psych: euthymic mood, full affect  EKG:  EKG is ordered today. The ekg ordered today shows nsr w P-synchronous/ AV  pacing with QRS upright V1 and neg 1    Device interrogation is reviewed today in detail.  See PaceArt for details.   Recent Labs: 05/04/2015: ALT 34; Hemoglobin 11.6*; Platelets 178.0; TSH 2.25 06/09/2015: BUN 20; Creatinine, Ser 1.56*; Potassium 4.5; Sodium 139    Lipid Panel     Component Value Date/Time   CHOL 112 11/23/2014 0447   CHOL 156 02/26/2014 1532   TRIG 94 11/23/2014 0447   TRIG 67.0 02/26/2014 1532   HDL 37* 11/23/2014 0447   HDL 63.00 02/26/2014 1532   CHOLHDL 2 02/26/2014 1532   VLDL 19 11/23/2014 0447   VLDL 13.4 02/26/2014 1532   LDLCALC 56 11/23/2014 0447   LDLCALC 80 02/26/2014 1532     Wt Readings from Last 3 Encounters:  12/22/15 280 lb 8 oz (127.234 kg)  06/09/15 268 lb 8 oz (121.791 kg)  05/18/15 275 lb 8 oz (124.966 kg)      Other studies Reviewed: Additional studies/ records that were reviewed today include: none   Review of the above records today demonstrates: as abiove   ASSESSMENT AND PLAN:  Ventricular tachycardia  Ischemic cardiomyopathy  Left ventricular apical thrombus  Implantable defibrillator-[employer]  Morbid obesity  Congestive heart failure-chronic systolic  Renal insufficiency grade 3     He is doing well  Without symptoms of ischemia;  all chart was reviewed. I have still not clear what happened to his 95% LAD lesion. I have  been in touch with Dr. Knute Neu.  Euvolemic continue current meds  No intercurrent Ventricular tachycardia  Will check amio labs and renal function   Will arrange his f/u wth TG to be moved to 6 months  At that time we can repeat amio surveillance labs     Current medicines are reviewed at length with the patient today.   The patient does not  concerns regarding his medicines.  The following changes were made today:  Dose down titration as noted  Labs/ tests ordered today include:    Orders Placed This Encounter  Procedures  . EKG 12-Lead     Disposition:   FU with me 12  month(s)  Signed, Sherryl Manges, MD  12/22/2015 10:07 AM     [facility] [address] [place] [address] [phone] (office) [phone] (fax)

## 2015-12-23 LAB — COMPREHENSIVE METABOLIC PANEL
ALBUMIN: 3.8 g/dL (ref 3.6–4.8)
ALT: 24 IU/L (ref 0–44)
AST: 24 IU/L (ref 0–40)
Albumin/Globulin Ratio: 1.3 (ref 1.1–2.5)
Alkaline Phosphatase: 71 IU/L (ref 39–117)
BILIRUBIN TOTAL: 0.5 mg/dL (ref 0.0–1.2)
BUN/Creatinine Ratio: 12 (ref 10–22)
BUN: 17 mg/dL (ref 8–27)
CO2: 24 mmol/L (ref 18–29)
Calcium: 8.8 mg/dL (ref 8.6–10.2)
Chloride: 102 mmol/L (ref 96–106)
Creatinine, Ser: 1.42 mg/dL — ABNORMAL HIGH (ref 0.76–1.27)
GFR calc Af Amer: 60 mL/min/{1.73_m2} (ref >59)
GFR, EST NON AFRICAN AMERICAN: 52 mL/min/{1.73_m2} — AB (ref >59)
GLOBULIN, TOTAL: 2.9 g/dL (ref 1.5–4.5)
Glucose: 94 mg/dL (ref 65–99)
POTASSIUM: 4.9 mmol/L (ref 3.5–5.2)
Sodium: 141 mmol/L (ref 134–144)
TOTAL PROTEIN: 6.7 g/dL (ref 6.0–8.5)

## 2015-12-23 LAB — TSH: TSH: 3.06 u[IU]/mL (ref 0.450–4.500)

## 2015-12-26 LAB — CUP PACEART INCLINIC DEVICE CHECK
Brady Statistic RA Percent Paced: 17 %
Implantable Lead Implant Date: 20141126
Implantable Lead Location: 753860
Implantable Lead Model: 158
Implantable Lead Model: 5076
Lead Channel Pacing Threshold Pulse Width: 0.5 ms
Lead Channel Sensing Intrinsic Amplitude: 22.9 mV
Lead Channel Sensing Intrinsic Amplitude: 25 mV
MDC IDC LEAD IMPLANT DT: 20080122
MDC IDC LEAD IMPLANT DT: 20141126
MDC IDC LEAD LOCATION: 753858
MDC IDC LEAD LOCATION: 753859
MDC IDC LEAD SERIAL: 175417
MDC IDC MSMT LEADCHNL LV PACING THRESHOLD AMPLITUDE: 1.4 V
MDC IDC MSMT LEADCHNL LV PACING THRESHOLD PULSEWIDTH: 1 ms
MDC IDC MSMT LEADCHNL RA PACING THRESHOLD AMPLITUDE: 0.7 V
MDC IDC MSMT LEADCHNL RA SENSING INTR AMPL: 4.4 mV
MDC IDC MSMT LEADCHNL RV PACING THRESHOLD AMPLITUDE: 0.8 V
MDC IDC MSMT LEADCHNL RV PACING THRESHOLD PULSEWIDTH: 0.5 ms
MDC IDC SESS DTM: 20170204102826
MDC IDC STAT BRADY RV PERCENT PACED: 100 %
Pulse Gen Serial Number: 101974

## 2015-12-31 ENCOUNTER — Other Ambulatory Visit: Payer: BLUE CROSS/BLUE SHIELD

## 2016-01-05 ENCOUNTER — Other Ambulatory Visit: Payer: BLUE CROSS/BLUE SHIELD

## 2016-01-07 ENCOUNTER — Ambulatory Visit (INDEPENDENT_AMBULATORY_CARE_PROVIDER_SITE_OTHER): Payer: BLUE CROSS/BLUE SHIELD

## 2016-01-07 ENCOUNTER — Other Ambulatory Visit: Payer: Self-pay

## 2016-01-07 ENCOUNTER — Encounter: Payer: Self-pay | Admitting: Internal Medicine

## 2016-01-07 DIAGNOSIS — I255 Ischemic cardiomyopathy: Secondary | ICD-10-CM | POA: Diagnosis not present

## 2016-02-08 IMAGING — CT CT ANGIO CHEST
2 of 6 series · 18 of 36 positions shown · IV contrast (APPLIED)
Comparison: Chest radiograph earlier today.

CLINICAL DATA: Patient became lightheaded and dizzy after minor
exertion. Subsequent development of chest pain. Tachycardia.

EXAM:
CT ANGIOGRAPHY CHEST WITH CONTRAST
TECHNIQUE: Multidetector CT imaging of the chest was performed using the
standard protocol during bolus administration of intravenous
contrast. Multiplanar CT image reconstructions and MIPs were
obtained to evaluate the vascular anatomy.
CONTRAST:  100 mL Isovue 350.

[Series 5: pe 1.0 thins · axial · 0.68mm/px · z∈[-646,-406]mm · 17 of 271 slices shown]
[im 16/271  lung]
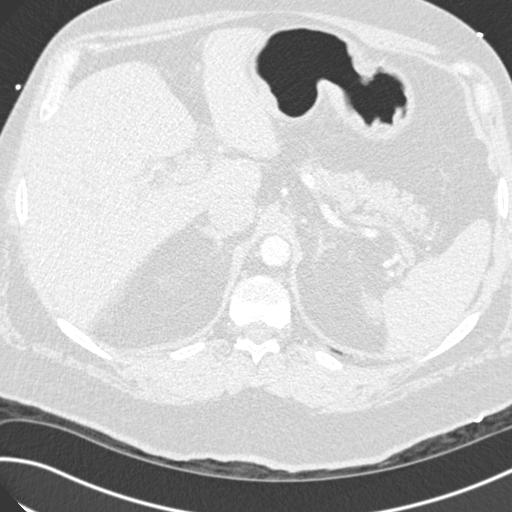
[im 31/271  mediastinal]
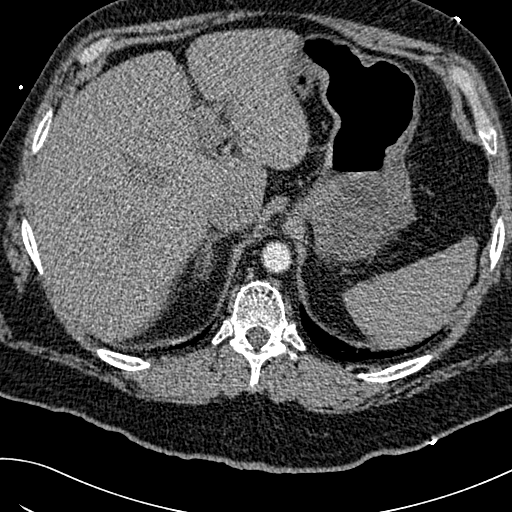
[im 46/271  lung]
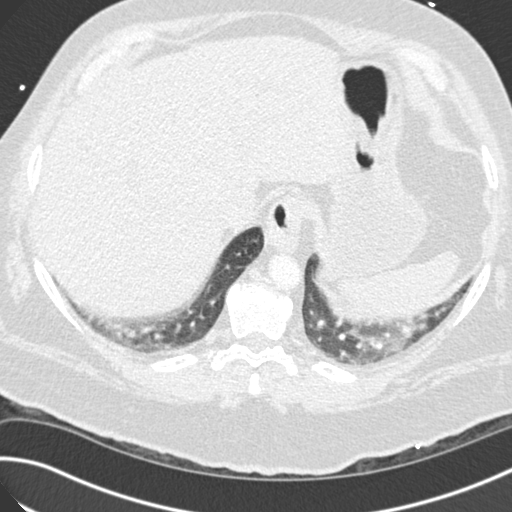
[im 61/271  mediastinal]
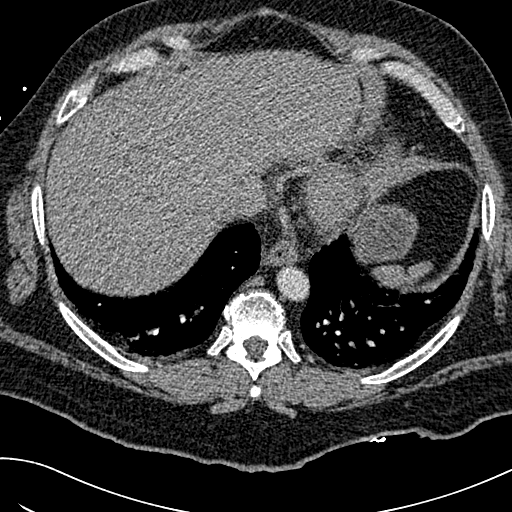
[im 76/271  lung]
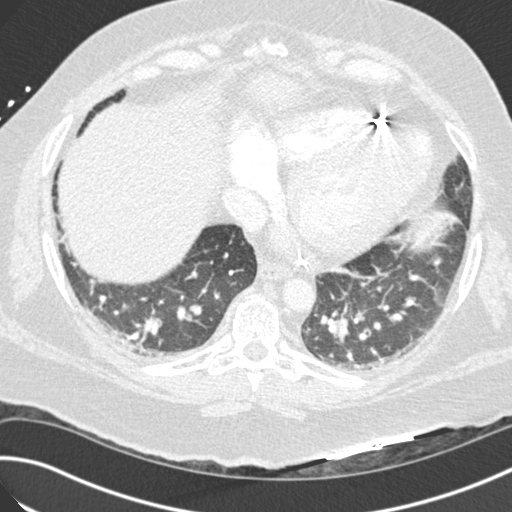
[im 91/271  mediastinal]
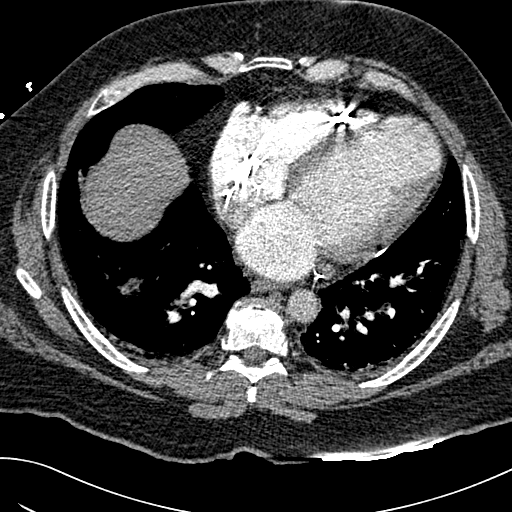
[im 106/271  lung]
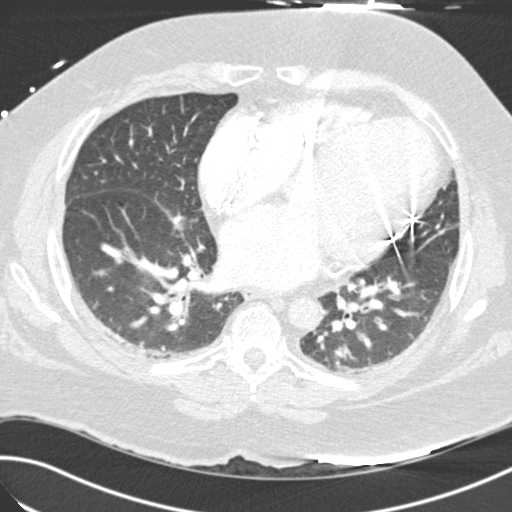
[im 121/271  mediastinal]
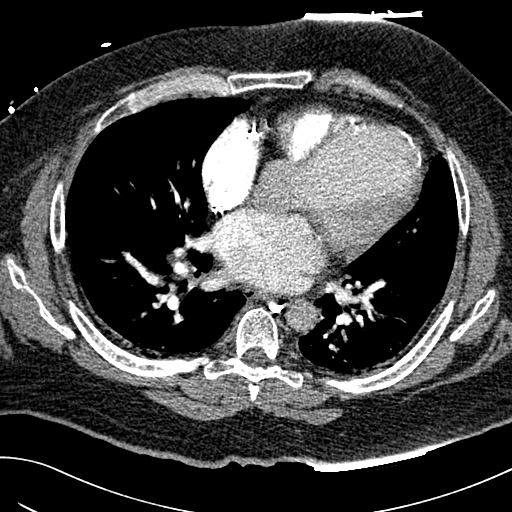
[im 136/271  lung]
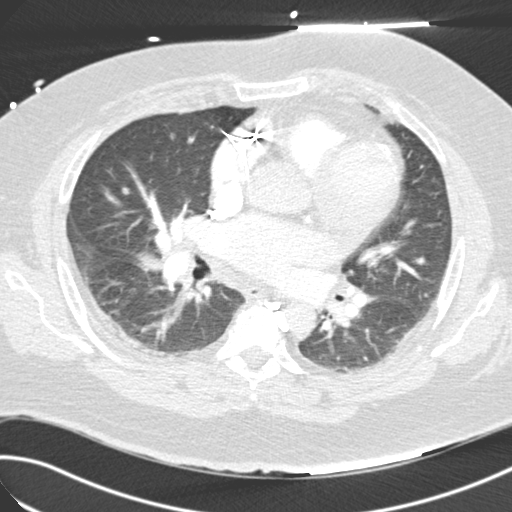
[im 151/271  mediastinal]
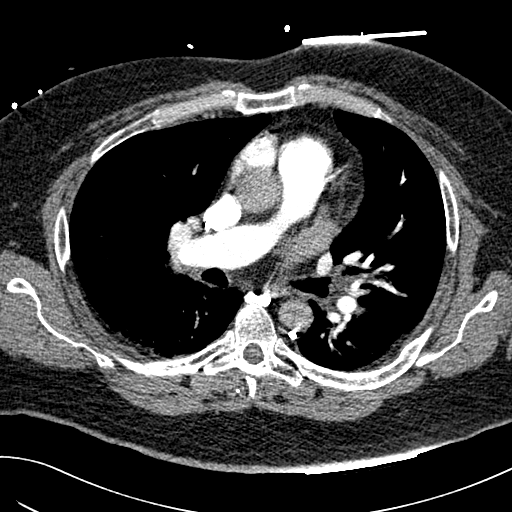
[im 166/271  lung]
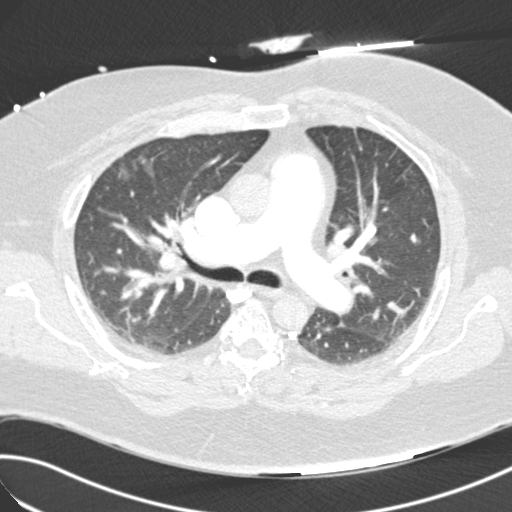
[im 181/271  mediastinal]
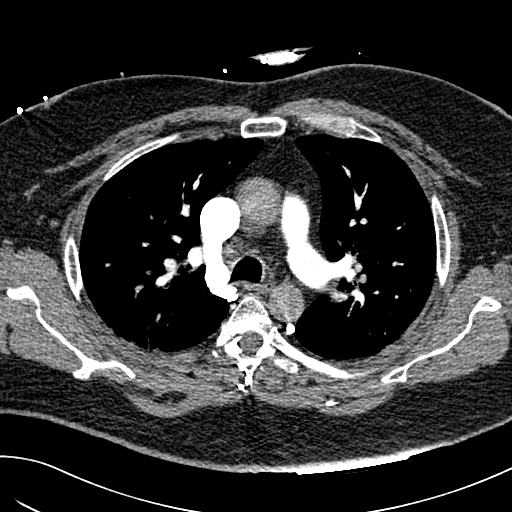
[im 196/271  lung]
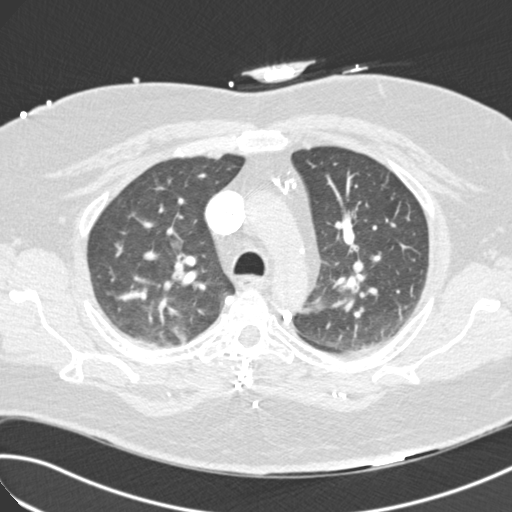
[im 211/271  mediastinal]
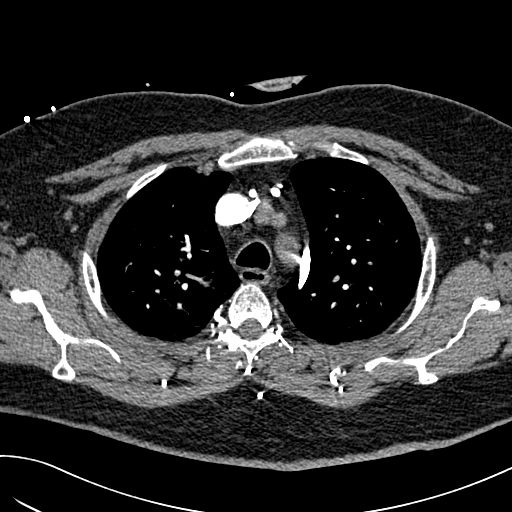
[im 226/271  lung]
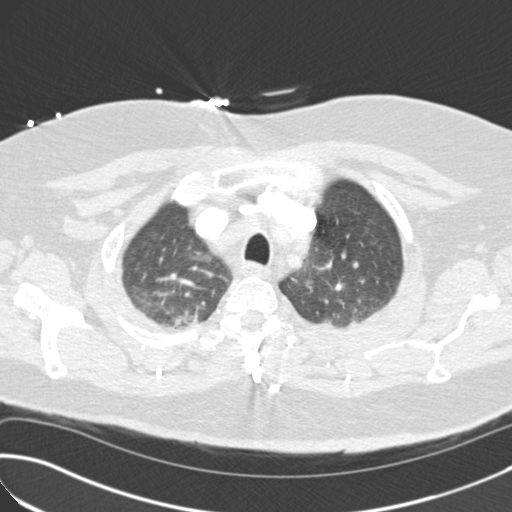
[im 241/271  mediastinal]
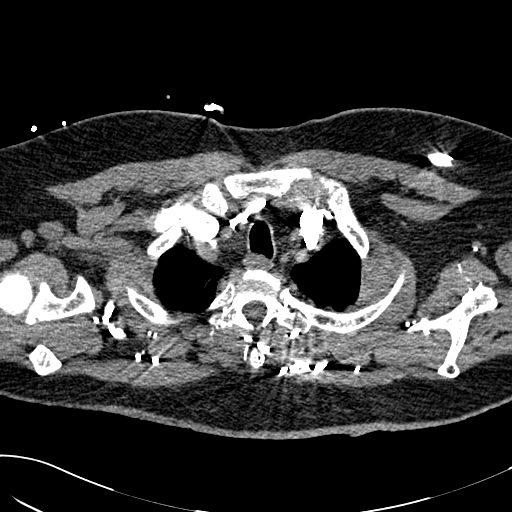
[im 256/271  lung]
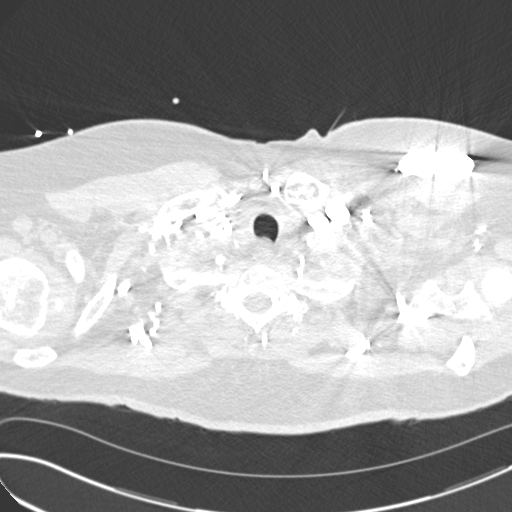

[Series 7: cor pe 2.0 mpr · coronal · 0.54mm/px · 1 of 155 slices shown]
[im 78/155  mediastinal]
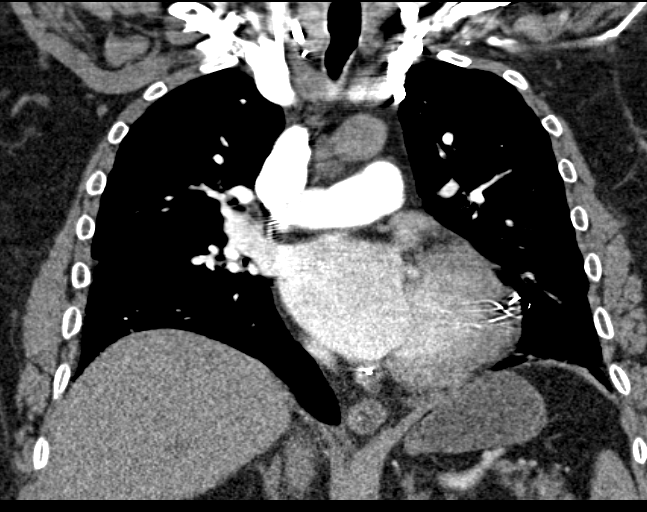

[18 of 36 positions shown; findings below may reference images not displayed]

FINDINGS: Mediastinum: No filling defects are noted within the pulmonary
arterial tree to suggest pulmonary emboli. Heart size is increased.
Coronary artery calcification. Internal defibrillator. No
pericardial fluid, thickening or calcification. No acute abnormality
of the thoracic aorta or other great vessels of the mediastinum. No
pathologically enlarged mediastinal or hilar lymph nodes. The
esophagus is normal in appearance. RIGHT IJ catheter extends into
the proximal RIGHT atrium.

Lungs/Pleura: No consolidative airspace disease. No pleural
effusions. No suspicious appearing pulmonary nodules or masses.

Upper Abdomen: Visualized portions of the upper abdomen are
unremarkable. Prior cholecystectomy.

Musculoskeletal: No aggressive appearing lytic or blastic lesions
are noted in the visualized portions of the skeleton.

Review of the MIP images confirms the above findings.
IMPRESSION: No evidence for pulmonary emboli.  No acute chest findings.

## 2016-02-15 ENCOUNTER — Other Ambulatory Visit: Payer: Self-pay | Admitting: Internal Medicine

## 2016-03-14 ENCOUNTER — Telehealth: Payer: Self-pay | Admitting: Internal Medicine

## 2016-03-14 NOTE — Telephone Encounter (Signed)
Pt is not currently in office. Forward back to Dr. Graciela HusbandsKlein to further advise.

## 2016-03-14 NOTE — Telephone Encounter (Signed)
Pt and wife came into office Pt wife states pt heart racing since 1 pm on 03/12/16 Would go up and then go down, she states Wearing patient out Breathing not normal This morning around 630 am 92/81 hr 102  Would like see if there's someone who can check his device.  Please advise

## 2016-03-14 NOTE — Telephone Encounter (Signed)
f he is still here can you do ECG

## 2016-03-14 NOTE — Telephone Encounter (Signed)
S/w pt who came into the office this morning with his wife. Reports he was working in the yard Saturday in the heat and felt his HR was elevated. Upon checking, his HR was 125.  He took an extra coreg. States elevated HR lasted 2-3 hours then returned to normal. Since Saturday, he reports periods of  HR in low 100s even while sitting and resting. This morning it was 102.  States he is winded.  He is able to carry on a conversation without showing signs of distress. He has a Research [employer] device and sent a transmission during the time he was symptomatic. Pt takes coreg 6.25 BID and amiodarone 200mg  daily with no missed doses. No new medications added since Jan OV w/Dr. Graciela HusbandsKlein.  As he does not appear to be in any distress, advised pt that I will call [facility] to confirm receipt of transmission and follow up with findings.  He understands to continue to monitor BP and HR and if symptoms worse, he would need to call back for appt or proceed to Urgent Care or ED for further evaluation. Pt verbalized understanding and asks I call his cell phone as he is going to work now.  S[facility]w Kara [facility]Emma at [facility] who confirmed receipt of Saturday transmission. She advised that transmission looked normal and nothing needs to be done at this time.  S/w pt and advised of Emma's findings. Informed pt I will forward information to Dr. Hoover BrunetteKlein/Heather, RN for further review and to advise. Pt verbalized understanding with no further questions.

## 2016-03-16 ENCOUNTER — Emergency Department (HOSPITAL_COMMUNITY)
Admission: EM | Admit: 2016-03-16 | Discharge: 2016-03-21 | Disposition: E | Payer: BLUE CROSS/BLUE SHIELD | Attending: Emergency Medicine | Admitting: Emergency Medicine

## 2016-03-16 ENCOUNTER — Telehealth: Payer: Self-pay | Admitting: Internal Medicine

## 2016-03-16 DIAGNOSIS — M1990 Unspecified osteoarthritis, unspecified site: Secondary | ICD-10-CM | POA: Insufficient documentation

## 2016-03-16 DIAGNOSIS — E669 Obesity, unspecified: Secondary | ICD-10-CM | POA: Insufficient documentation

## 2016-03-16 DIAGNOSIS — Z95810 Presence of automatic (implantable) cardiac defibrillator: Secondary | ICD-10-CM | POA: Insufficient documentation

## 2016-03-16 DIAGNOSIS — I10 Essential (primary) hypertension: Secondary | ICD-10-CM | POA: Diagnosis not present

## 2016-03-16 DIAGNOSIS — Z87442 Personal history of urinary calculi: Secondary | ICD-10-CM | POA: Insufficient documentation

## 2016-03-16 DIAGNOSIS — Z79899 Other long term (current) drug therapy: Secondary | ICD-10-CM | POA: Insufficient documentation

## 2016-03-16 DIAGNOSIS — Z8619 Personal history of other infectious and parasitic diseases: Secondary | ICD-10-CM | POA: Insufficient documentation

## 2016-03-16 DIAGNOSIS — I469 Cardiac arrest, cause unspecified: Secondary | ICD-10-CM

## 2016-03-16 DIAGNOSIS — I252 Old myocardial infarction: Secondary | ICD-10-CM | POA: Diagnosis not present

## 2016-03-16 DIAGNOSIS — E785 Hyperlipidemia, unspecified: Secondary | ICD-10-CM | POA: Diagnosis not present

## 2016-03-16 DIAGNOSIS — Z7982 Long term (current) use of aspirin: Secondary | ICD-10-CM | POA: Insufficient documentation

## 2016-03-16 DIAGNOSIS — Z87891 Personal history of nicotine dependence: Secondary | ICD-10-CM | POA: Insufficient documentation

## 2016-03-16 DIAGNOSIS — Z9889 Other specified postprocedural states: Secondary | ICD-10-CM | POA: Insufficient documentation

## 2016-03-16 MED ORDER — OXYCODONE-ACETAMINOPHEN 5-325 MG PO TABS: 1.0000 | ORAL_TABLET | Freq: Once | ORAL | Status: DC

## 2016-03-16 MED ORDER — ETOMIDATE 2 MG/ML IV SOLN
INTRAVENOUS | Status: AC | PRN
  Administered 2016-03-16: 20 mg via INTRAVENOUS

## 2016-03-16 MED ORDER — SODIUM BICARBONATE 8.4 % IV SOLN
INTRAVENOUS | Status: AC | PRN
  Administered 2016-03-16: 100 meq via INTRAVENOUS

## 2016-03-16 MED ORDER — SUCCINYLCHOLINE CHLORIDE 20 MG/ML IJ SOLN
INTRAMUSCULAR | Status: AC | PRN
  Administered 2016-03-16: 150 mg via INTRAVENOUS

## 2016-03-16 MED ORDER — CALCIUM CHLORIDE 10 % IV SOLN
INTRAVENOUS | Status: AC | PRN
  Administered 2016-03-16: 1 g via INTRAVENOUS

## 2016-03-16 MED ORDER — EPINEPHRINE HCL 0.1 MG/ML IJ SOSY
PREFILLED_SYRINGE | INTRAMUSCULAR | Status: AC | PRN
Start: 2016-03-16 — End: 2016-03-16
  Administered 2016-03-16 (×4): 1 via INTRAVENOUS

## 2016-03-16 MED FILL — Medication: Qty: 1 | Status: AC

## 2016-03-17 ENCOUNTER — Ambulatory Visit: Payer: BLUE CROSS/BLUE SHIELD | Admitting: Family Medicine

## 2016-03-17 NOTE — Telephone Encounter (Signed)
In patient record to follow up, patient is currently deceased- Dr. Graciela HusbandsKlein made aware.

## 2016-03-21 ENCOUNTER — Telehealth: Payer: Self-pay | Admitting: Internal Medicine

## 2016-03-21 NOTE — Progress Notes (Signed)
I released Chaplain Resident Harlan Stains(Bea) who was with family and Dr. while Dr. was giving family update on patient status. Family deceased. I escorted Father and Mother to spend time with son. Remained with family until their departure. Provided emotional and spiritual support to family. Chaplain available as needed.  Pager [phone]

## 2016-03-21 NOTE — Telephone Encounter (Signed)
Patient Name: Lonney Hofstra  DOB: 1951/01/25    Initial Comment Caller states has extLynda Rainwaterreme shortness of breath, DX w/ pneumonia - given ABX but getting worse    Nurse Assessment  Nurse: Scarlette ArStandifer, RN, Heather Date/Time (Eastern Time): 07/06/2016 11:11:00 AM  Confirm and document reason for call. If symptomatic, describe symptoms. You must click the next button to save text entered. ---Caller states that he was diagnosed with bronchitis on Monday and was given antibiotics, but he is having SOB and fatigue today  Has the patient traveled out of the country within the last 30 days? ---Not Applicable  Does the patient have any new or worsening symptoms? ---Yes  Will a triage be completed? ---Yes  Related visit to physician within the last 2 weeks? ---Yes  Does the PT have any chronic conditions? (i.e. diabetes, asthma, etc.) ---Yes  List chronic conditions. ---See MR  Is this a behavioral health or substance abuse call? ---No     Guidelines    Guideline Title Affirmed Question Affirmed Notes  Cough - Acute Non-Productive Difficulty breathing    Final Disposition User   Go to ED Now Standifer, RN, Avery DennisonHeather    Referrals  [facility] - ED   Disagree/Comply: Comply

## 2016-03-21 NOTE — Code Documentation (Signed)
Family updated as to patient's status.

## 2016-03-21 NOTE — ED Notes (Signed)
Pts wedding band given directly to son Darci CurrentDarren Burdett.

## 2016-03-21 NOTE — Telephone Encounter (Signed)
I called patients home number to follow-up & be sure he agreed to go to the ER since he was not showing arrived at ER in epic. I left him a message to call us with a follow-up.

## 2016-03-21 NOTE — ED Provider Notes (Signed)
CSN: [id number]     Arrival date & time 06-20-2016  1251 History   First MD Initiated Contact with Patient [id number] [id number]     Chief Complaint  Patient presents with  . Cardiac Arrest     (Consider location/radiation/quality/duration/timing/severity/associated sxs/prior Treatment) HPI  A LEVEL 5 CAVEAT PERTAINS DUE TO CARDIAC ARREST, UNRESPONSIVE. Pt presents via EMS- was found unresponsive in his car.  Per EMS report was feeling unwell at work and went to sit in his car.  On EMS arrival he was in asystole, CPR intitiated- multiple rounds of epi have been given.  Pt was not able to be intubated by paramedics- he arrives with BVM and in ongoing asystole.    Past Medical History  Diagnosis Date  . Ischemic cardiomyopathy     s/p ICD implant 2008 with subsequent upgrade to CRTD 2014  . LBBB (left bundle branch block)   . Obesity   . Hypertension   . Ventricular tachycardia (HCC)     syncopal  treated via ICD  . Chicken pox   . Heart disease   . Hyperlipidemia   . Kidney stone   . Gout   . Automatic implantable cardioverter-defibrillator in situ   . Myocardial infarction (HCC) 1991  . Arthritis   . 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 Implantable cardioverter defibrillator implant  2008    as part of MADIT-CRT trial - single chamber device  . Cholecystectomy    . Appendectomy  1955  . Knee surgery  2005  . Bi-ventricular implantable cardioverter defibrillator upgrade  10-16-2013    CRTD upgrade by Dr Graciela HusbandsKlein  . Icd generator change  10/16/2013    Dr Graciela HusbandsKlein  . Biv icd genertaor change out N/A 10/16/2013    Procedure: BIV ICD GENERTAOR CHANGE OUT;  Surgeon: Duke SalviaSteven C Klein, MD;  [facility];  Service: Cardiovascular;  Laterality: N/A;  . Cardiac catheterization  11/10/2014    [facility]   Family History  Problem Relation Age of Onset  . Arthritis Mother     rheumatoid  . Heart disease Mother 4573    4 vessel  CABG  . Heart disease Father   . Mental illness Father     alzhemiers   Social History  Substance Use Topics  . Smoking status: Former Smoker -- 1.00 packs/day for 30 years    Types: Cigarettes    Quit date: 06/08/1990  . Smokeless tobacco: Never Used     Comment: Tobacco use-no  . Alcohol Use: 1.5 oz/week    3 drink(s) per week     Comment: Occasional    Review of Systems  UNABLE TO OBTAIN ROS DUE TO LEVEL 5 CAVEAT    Allergies  Ace inhibitors and Aldosterone  Home Medications   Prior to Admission medications   Medication Sig Start Date End Date Taking? Authorizing Provider  amiodarone (PACERONE) 200 MG tablet Take 1 tablet (200 mg total) by mouth daily. 07/16/15   Duke SalviaSteven C Klein, MD  aspirin 81 MG tablet Take 81 mg by mouth daily.      Historical Provider, MD  atorvastatin (LIPITOR) 40 MG tablet TAKE ONE TABLET BY MOUTH THREE TIMES EACH WEEK 02/16/16   Antonieta Ibaimothy J Gollan, MD  carvedilol (COREG) 6.25 MG tablet Take 1 tablet (6.25 mg total) by mouth 2 (two) times daily. 12/22/15   Duke SalviaSteven C Klein, MD  furosemide (LASIX) 40 MG tablet Take 1 tablet (40 mg total) by  mouth 2 (two) times daily as needed. 12/22/15   Duke Salvia, MD  magnesium oxide (MAG-OX) 400 MG tablet Take 400 mg by mouth every other day. 10/24/12   Sherlene Shams, MD  multivitamin [facility]) per tablet Take 1 tablet by mouth daily.      Historical Provider, MD  nitroGLYCERIN (NITROSTAT) 0.4 MG SL tablet Place 1 tablet (0.4 mg total) under the tongue every 5 (five) minutes as needed for chest pain. 10/17/13   Roger A Arguello, PA-C  Omega-3 Fatty Acids (FISH OIL) 1000 MG CAPS Take 1,000 mg by mouth every other day.     Historical Provider, MD   There were no vitals taken for this visit. Physical Exam  Physical Examination: General appearance - unresponsive Mental status - unresponsive Eyes - pupils fixed and dilated Mouth - OP clear Chest - BSS after intubation, bilateral chest risse Heart - heart sounds  absent, no pulses Abdomen - soft, Neurological - unresponsive Extremities - peripheral pulses normal, no pedal edema, no clubbing or cyanosis Skin -mottled, cool  ED Course  Procedures (including critical care time)  Cardiopulmonary Resuscitation (CPR) Procedure Note Directed/Performed by: Ethelda Chick I personally directed ancillary staff and/or performed CPR in an effort to regain return of spontaneous circulation and to maintain cardiac, neuro and systemic perfusion.    INTUBATION Performed by: Ethelda Chick  Required items: required blood products, implants, devices, and special equipment available Patient identity confirmed: provided demographic data and hospital-assigned identification number Time out: Immediately prior to procedure a "time out" was called to verify the correct patient, procedure, equipment, support staff and site/side marked as required.  Indications: cardiac and respiratory arrest  Intubation method: Glidescope Laryngoscopy   Preoxygenation: BVM  Sedatives:  Etomidate Paralytic:  Succinylcholine  Tube Size: 8.0  cuffed  Post-procedure assessment: chest rise and ETCO2 monitor Breath sounds: equal and absent over the epigastrium Tube secured with: ETT holder  Patient tolerated the procedure well with no immediate complications. RSI meds given due to tight clenching of jaw during first attempt at intubation.      Labs Review Labs Reviewed - No data to display  Imaging Review No results found. I have personally reviewed and evaluated these images and lab results as part of my medical decision-making.   EKG Interpretation None      MDM   Final diagnoses:  Cardiac arrest (HCC)    Pt seen immediately upon arrival- pt in asystolic arrest, not intubated.  Presenting with BVM.  ACLS contineud with compressions and epinephrine.  On first intubation attempt unable to scissor jaw due to clenching of teeth.  Pt given etomidate and  succ- 2nd intubation attempt successful.  ACLS continued once airway secured.  He remained in asystole despite our efforts.    1:51 PM family updated, pt's primary care doctor is Dr. Darrick Huntsman, [place].  Will try to contact.    2:08 PM d/w Dr. Darrick Huntsman, pt's primary doctor at [place]- she will sign the death certificate.    2:10 PM pt is not an ME case.   Jerelyn Scott, MD 04/02/2016 [phone]

## 2016-03-21 NOTE — Telephone Encounter (Signed)
Signed given to Clydie BraunKaren and at front desk with copy for chart made.

## 2016-03-21 NOTE — Telephone Encounter (Signed)
Patient is in the ER. 

## 2016-03-21 NOTE — Telephone Encounter (Signed)
Pt's death certificate is in Dr. Melina Schoolsullo's box to sign.

## 2016-03-21 NOTE — Telephone Encounter (Signed)
Pt wife calling asking about patient last device reading that patient sent in. She also would like to know if she can stop by the office one day and talk to Dr Graciela HusbandsKlein about patient. She would not tell me what it was about Please advise.

## 2016-03-21 NOTE — ED Notes (Signed)
[facility] ED [facility] with CPR in progress

## 2016-03-21 NOTE — Code Documentation (Signed)
Organ procurement team notified.

## 2016-03-21 DEATH — deceased

## 2016-03-22 NOTE — Telephone Encounter (Signed)
Patient is currently deceased. Will forward to the [facility] to review last transmissions.

## 2016-03-23 ENCOUNTER — Telehealth: Payer: Self-pay | Admitting: Internal Medicine

## 2016-03-23 NOTE — Telephone Encounter (Signed)
Funeral home called and wanted to know if patient was a smoker. Patient is a former smoker.

## 2016-03-28 NOTE — Telephone Encounter (Signed)
We can get the last transmissions, and then I will call his wife later this wwek

## 2016-03-29 NOTE — Telephone Encounter (Signed)
Pt calling back to speak with Dr. Graciela HusbandsKlein. States she would like to come in and speak to him regarding her deceased husband.

## 2016-03-29 NOTE — Telephone Encounter (Signed)
Per Dr. Graciela HusbandsKlein, he will meet with the patient's wife on 03/31/16 at 8:30 am in the [place] office. Mrs. Ardelle AntonWagoner is aware and agreeable.
